# Patient Record
Sex: Male | Born: 1940 | Race: White | Hispanic: No | Marital: Married | Smoking: Never smoker
Health system: Southern US, Community
[De-identification: ages and names within clinical notes are randomized; demographics above are authoritative.]

## PROBLEM LIST (undated history)

## (undated) DIAGNOSIS — G309 Alzheimer's disease, unspecified: Secondary | ICD-10-CM

## (undated) DIAGNOSIS — I509 Heart failure, unspecified: Secondary | ICD-10-CM

## (undated) DIAGNOSIS — F0280 Dementia in other diseases classified elsewhere without behavioral disturbance: Secondary | ICD-10-CM

## (undated) DIAGNOSIS — R269 Unspecified abnormalities of gait and mobility: Secondary | ICD-10-CM

## (undated) DIAGNOSIS — G629 Polyneuropathy, unspecified: Secondary | ICD-10-CM

## (undated) DIAGNOSIS — R32 Unspecified urinary incontinence: Secondary | ICD-10-CM

## (undated) DIAGNOSIS — M21372 Foot drop, left foot: Secondary | ICD-10-CM

## (undated) DIAGNOSIS — M21371 Foot drop, right foot: Secondary | ICD-10-CM

## (undated) DIAGNOSIS — I10 Essential (primary) hypertension: Secondary | ICD-10-CM

## (undated) DIAGNOSIS — E1142 Type 2 diabetes mellitus with diabetic polyneuropathy: Secondary | ICD-10-CM

## (undated) DIAGNOSIS — F0390 Unspecified dementia without behavioral disturbance: Secondary | ICD-10-CM

## (undated) DIAGNOSIS — E119 Type 2 diabetes mellitus without complications: Secondary | ICD-10-CM

## (undated) HISTORY — PX: OTHER SURGICAL HISTORY: SHX169

## (undated) HISTORY — DX: Unspecified abnormalities of gait and mobility: R26.9

## (undated) HISTORY — DX: Alzheimer's disease, unspecified: G30.9

## (undated) HISTORY — DX: Type 2 diabetes mellitus without complications: E11.9

## (undated) HISTORY — DX: Dementia in other diseases classified elsewhere without behavioral disturbance: F02.80

## (undated) HISTORY — DX: Foot drop, left foot: M21.371

## (undated) HISTORY — DX: Polyneuropathy, unspecified: G62.9

## (undated) HISTORY — DX: Unspecified dementia, unspecified severity, without behavioral disturbance, psychotic disturbance, mood disturbance, and anxiety: F03.90

## (undated) HISTORY — DX: Heart failure, unspecified: I50.9

## (undated) HISTORY — DX: Type 2 diabetes mellitus with diabetic polyneuropathy: E11.42

## (undated) HISTORY — DX: Unspecified urinary incontinence: R32

## (undated) HISTORY — DX: Essential (primary) hypertension: I10

## (undated) HISTORY — DX: Foot drop, right foot: M21.372

---

## 2018-06-27 ENCOUNTER — Other Ambulatory Visit (HOSPITAL_COMMUNITY)
Admission: RE | Admit: 2018-06-27 | Discharge: 2018-06-27 | Disposition: A | Discharge status: Home / Self Care | Payer: Medicare Other | Source: Other Acute Inpatient Hospital | Attending: Urology | Admitting: Urology

## 2018-06-27 ENCOUNTER — Ambulatory Visit (INDEPENDENT_AMBULATORY_CARE_PROVIDER_SITE_OTHER): Payer: Medicare Other | Admitting: Urology

## 2018-06-27 DIAGNOSIS — N401 Enlarged prostate with lower urinary tract symptoms: Secondary | ICD-10-CM | POA: Diagnosis not present

## 2018-06-27 DIAGNOSIS — N3001 Acute cystitis with hematuria: Secondary | ICD-10-CM | POA: Diagnosis present

## 2018-06-27 DIAGNOSIS — R310 Gross hematuria: Secondary | ICD-10-CM

## 2018-06-27 DIAGNOSIS — N471 Phimosis: Secondary | ICD-10-CM

## 2018-06-27 DIAGNOSIS — R350 Frequency of micturition: Secondary | ICD-10-CM

## 2018-06-27 DIAGNOSIS — R32 Unspecified urinary incontinence: Secondary | ICD-10-CM | POA: Diagnosis not present

## 2018-06-30 LAB — URINE CULTURE: Culture: >=100000 — AB

## 2018-07-19 ENCOUNTER — Encounter: Payer: Self-pay | Admitting: Neurology

## 2018-07-19 ENCOUNTER — Telehealth: Payer: Self-pay | Admitting: Neurology

## 2018-07-19 ENCOUNTER — Ambulatory Visit (INDEPENDENT_AMBULATORY_CARE_PROVIDER_SITE_OTHER): Payer: Medicare Other | Admitting: Neurology

## 2018-07-19 ENCOUNTER — Other Ambulatory Visit: Payer: Self-pay

## 2018-07-19 VITALS — BP 130/68 | HR 68 | Ht 71.0 in

## 2018-07-19 DIAGNOSIS — R413 Other amnesia: Secondary | ICD-10-CM

## 2018-07-19 DIAGNOSIS — R269 Unspecified abnormalities of gait and mobility: Secondary | ICD-10-CM | POA: Diagnosis not present

## 2018-07-19 DIAGNOSIS — E538 Deficiency of other specified B group vitamins: Secondary | ICD-10-CM | POA: Diagnosis not present

## 2018-07-19 MED ORDER — ALPRAZOLAM 0.5 MG PO TABS: ORAL_TABLET | ORAL | 0 refills | Status: DC

## 2018-07-19 NOTE — Progress Notes (Signed)
Reason for visit: Dementia, gait disorder  Referring physician: Dr. [name] is a 77 y.o. male  History of present illness:  Richard Eaton is a 77 year old right-handed white male with a history of diabetes.  The patient apparently has never really gone to doctors throughout his life, he comes in with his wife today who gives an excellent history.  The patient apparently began having problems with walking that began in 2017.  Shortly thereafter, the patient was noted to have a left-sided foot drop, and he began to have some trouble controlling the bladder.  The patient also began having some troubles with memory around that time.  These issues have gradually progressed, he never went to the doctor about these issues.  The patient became severely disabled toward the end of June 2019.  He was unable to walk at all 3 weeks prior to the June 30 admission.  The patient was quite confused, agitated and combative.  The patient underwent a CT scan of the brain that is brought for my review.  This shows at least a moderate level small vessel ischemic changes.  There may be some mild ventricular enlargement, but not severe.  A partial MRI of the brain was done.  The patient has been placed back into the home environment, he is getting physical therapy, he is complaining of ongoing dizziness and he has continued to have hallucinations.  He continues to have a memory issue.  He denies any neck pain or low back pain.  He denies numbness of the extremities or burning in the feet.  He continues to have urinary incontinence.  The patient requires assistance with standing.  He is unable to stand independently.  He is sent to this office for an evaluation.  Past Medical History:  Diagnosis Date  . Bilateral foot-drop   . Congestive heart failure (CHF) (HCC)   . Dementia   . Diabetes (HCC)   . Diabetic peripheral neuropathy (HCC)   . Gait disorder   . Hypertension   . Urinary incontinence      History reviewed. No pertinent surgical history.  Family History  Problem Relation Age of Onset  . Heart attack Father     Social history:  has no tobacco, alcohol, and drug history on file.  Medications:  Prior to Admission medications   Medication Sig Start Date End Date Taking? Authorizing Provider  amLODipine (NORVASC) 10 MG tablet Take 10 mg by mouth daily.   Yes [provider]  EASY COMFORT LANCETS MISC USE TO TEST BLOOD SUGAR TWICE DAILY 05/30/18  Yes [provider]  glucose blood test strip USE TO TEST BLOOD SUGAR TWICE DAILY 05/30/18  Yes [provider]  losartan (COZAAR) 100 MG tablet Take 1 tablet by mouth daily. 06/29/18  Yes [provider]  metFORMIN (GLUCOPHAGE-XR) 500 MG 24 hr tablet Take 1 tablet by mouth daily with breakfast.  06/12/18  Yes [provider]  mirtazapine (REMERON) 15 MG tablet Take 15 mg by mouth at bedtime. 06/12/18  Yes [provider]  promethazine (PHENERGAN) 25 MG tablet Take 25 mg by mouth as needed. 06/29/18  Yes [provider]  sertraline (ZOLOFT) 25 MG tablet Take 25 mg by mouth daily. 06/12/18 12/09/18 Yes [provider]  tamsulosin (FLOMAX) 0.4 MG CAPS capsule Take 0.4 mg by mouth at bedtime. 06/28/18  Yes [provider]  triamcinolone cream (KENALOG) 0.1 % Apply 1 application topically as needed.  06/12/18 06/09/2019 Yes [provider]  ALPRAZolam (XANAX) 0.5 MG tablet Take 2 tablets approximately 45 minutes prior to the MRI study, take a third tablet if needed. 07/19/18   York Spaniel, MD     No Known Allergies  ROS:  Out of a complete 14 system review of symptoms, the patient complains only of the following symptoms, and all other reviewed systems are negative.  Fatigue Hearing loss Bowel and urine incontinence Feeling cold Confusion, dizziness Anxiety, too much sleep, disinterest in activities, hallucinations  Blood pressure 130/68, pulse 68,  height 5\' 11"  (1.803 m), SpO2 98 %.  Physical Exam  General: The patient is alert and cooperative at the time of the examination.  Eyes: Pupils are equal, round, and reactive to light. Discs are flat bilaterally.  Neck: The neck is supple, no carotid bruits are noted.  Respiratory: The respiratory examination is clear.  Cardiovascular: The cardiovascular examination reveals a regular rate and rhythm, no obvious murmurs or rubs are noted.  Skin: Extremities are without significant edema.  Neurologic Exam  Mental status: The patient is alert and oriented x 2 at the time of the examination (not oriented to date). The Mini-Mental status examination done today shows a total score of 18/30.  Cranial nerves: Facial symmetry is present. There is good sensation of the face to pinprick and soft touch bilaterally. The strength of the facial muscles and the muscles to head turning and shoulder shrug are normal bilaterally. Speech is well enunciated, no aphasia or dysarthria is noted. Extraocular movements are full. Visual fields are full. The tongue is midline, and the patient has symmetric elevation of the soft palate. No obvious hearing deficits are noted.  Motor: The motor testing reveals 5 over 5 strength of all 4 extremities, with exception of prominent bilateral foot drops. Good symmetric motor tone is noted throughout.  Sensory: Sensory testing is intact to pinprick, soft touch, vibration sensation, and position sense on the upper extremities.  With the lower extremities, there is a stocking pattern pinprick sensory deficit to just below the knees bilaterally, significant impairment of vibration and position sense is noted in both feet.  No evidence of extinction is noted.  Coordination: Cerebellar testing reveals good finger-nose-finger and heel-to-shin bilaterally.  Gait and station: The patient requires assistance with standing.  Once up, he is only able to maintain balance by holding onto  the examiner.  He can take a few steps with the examiner, gait is wide-based and unsteady.  Tandem gait was not attempted.  Romberg is positive.  Reflexes: Deep tendon reflexes are symmetric, but are depressed bilaterally. Toes are downgoing bilaterally.   Assessment/Plan:  1.  Progressive dementia  2.  Progressive gait disorder  3.  Urinary incontinence  4.  Peripheral neuropathy by clinical examination  5.  Diabetes  6.  Bilateral foot drops  The patient has a relatively severe peripheral neuropathy that likely is associated in part with the balance issue.  CT scan evaluation shows at least a moderate level of small vessel ischemic changes, the gait disorder may be multifactorial in nature.  The possibility of normal pressure hydrocephalus does need to be considered but the patient does not appear to have severe ventriculomegaly.  MRI of the brain will be done to confirm the extent of the small vessel ischemic changes.  The patient is getting ongoing physical therapy.  Blood work will be done today.  The patient will follow-up in 3 months.  Marlan Palau MD 07/19/2018 12:07 PM  [facility] [address] [place], [place] [address]  Phone [phone] Fax [phone]

## 2018-07-19 NOTE — Telephone Encounter (Signed)
Medicare order sent to GI. No auth they will reach out to the pt to schedule.  °

## 2018-07-20 NOTE — Telephone Encounter (Signed)
Pt and pts wife Marjory SneddonJanye requesting a call as soon as possible to discuss GI, stating they would like to go elsewhere

## 2018-07-20 NOTE — Telephone Encounter (Signed)
Spoke to pt wife she did not want to go to GI.Marland Kitchen. The patient is scheduled at [facility] for [facility]/01/18 and to arrive at 11:30 am. They are aware of time & day. She also has their number of [phone] just incase they needed to r/s for any reason.

## 2018-07-21 LAB — ANA W/REFLEX: Anti Nuclear Antibody(ANA): NEGATIVE

## 2018-07-21 LAB — AMMONIA: Ammonia: 49 ug/dL (ref 27–102)

## 2018-07-21 LAB — MULTIPLE MYELOMA PANEL, SERUM
ALBUMIN/GLOB SERPL: 1.1 (ref 0.7–1.7)
ALPHA 1: 0.3 g/dL (ref 0.0–0.4)
ALPHA2 GLOB SERPL ELPH-MCNC: 0.9 g/dL (ref 0.4–1.0)
Albumin SerPl Elph-Mcnc: 3.7 g/dL (ref 2.9–4.4)
B-GLOBULIN SERPL ELPH-MCNC: 1.3 g/dL (ref 0.7–1.3)
GAMMA GLOB SERPL ELPH-MCNC: 1 g/dL (ref 0.4–1.8)
GLOBULIN, TOTAL: 3.5 g/dL (ref 2.2–3.9)
IGG (IMMUNOGLOBIN G), SERUM: 1094 mg/dL (ref 700–1600)
IGM (IMMUNOGLOBULIN M), SRM: 61 mg/dL (ref 15–143)
IgA/Immunoglobulin A, Serum: 355 mg/dL (ref 61–437)
Total Protein: 7.2 g/dL (ref 6.0–8.5)

## 2018-07-21 LAB — RHEUMATOID FACTOR: Rhuematoid fact SerPl-aCnc: <10 IU/mL (ref 0.0–13.9)

## 2018-07-21 LAB — COPPER, SERUM: Copper: 134 ug/dL (ref 72–166)

## 2018-07-21 LAB — RPR: RPR: NONREACTIVE

## 2018-07-21 LAB — SEDIMENTATION RATE: Sed Rate: 5 mm/hr (ref 0–30)

## 2018-07-21 LAB — ANGIOTENSIN CONVERTING ENZYME: Angio Convert Enzyme: 19 U/L (ref 14–82)

## 2018-07-21 LAB — HIV ANTIBODY (ROUTINE TESTING W REFLEX): HIV Screen 4th Generation wRfx: NONREACTIVE

## 2018-07-21 LAB — VITAMIN B12: Vitamin B-12: 645 pg/mL (ref 232–1245)

## 2018-07-21 LAB — B. BURGDORFI ANTIBODIES

## 2018-07-24 ENCOUNTER — Telehealth: Payer: Self-pay | Admitting: *Deleted

## 2018-07-24 NOTE — Telephone Encounter (Signed)
Called and spoke with wife, Richard BarefootJanye ("Janeen") ([place]) about lab results per Dr. Anne HahnWillis note. She verbalized understanding and will let her husband know. He was sleeping at time of call.

## 2018-07-24 NOTE — Telephone Encounter (Signed)
-----   Message from York Spanielharles K Willis, MD sent at 07/21/2018  1:49 PM EDT -----  The blood work results are unremarkable. Please call the patient.  ----- Message ----- From: Nell RangeInterface, [facility] Lab Results In Sent: 07/20/2018   7:39 AM EDT To: York Spanielharles K Willis, MD

## 2018-08-01 ENCOUNTER — Ambulatory Visit (HOSPITAL_COMMUNITY)
Admission: RE | Admit: 2018-08-01 | Discharge: 2018-08-01 | Disposition: A | Discharge status: Home / Self Care | Payer: Medicare Other | Source: Ambulatory Visit | Attending: Neurology | Admitting: Neurology

## 2018-08-01 ENCOUNTER — Telehealth: Payer: Self-pay | Admitting: Neurology

## 2018-08-01 DIAGNOSIS — R269 Unspecified abnormalities of gait and mobility: Secondary | ICD-10-CM

## 2018-08-01 DIAGNOSIS — I6782 Cerebral ischemia: Secondary | ICD-10-CM | POA: Diagnosis not present

## 2018-08-01 DIAGNOSIS — E1142 Type 2 diabetes mellitus with diabetic polyneuropathy: Secondary | ICD-10-CM

## 2018-08-01 DIAGNOSIS — R413 Other amnesia: Secondary | ICD-10-CM | POA: Diagnosis present

## 2018-08-01 MED ORDER — MEMANTINE HCL 5 MG PO TABS: ORAL_TABLET | ORAL | 0 refills | Status: DC

## 2018-08-01 NOTE — Telephone Encounter (Signed)
Faxed rx namenda to [facility] at [phone]. Received fax confirmation.   Faxed rx for lift chair to [facility] at [phone]. Received fax confirmation.

## 2018-08-01 NOTE — Telephone Encounter (Signed)
  I called the patient, talk with the wife.  The patient has a moderate level small vessel disease, no evidence of normal pressure hydrocephalus.  We will start Namenda for the memory, he has had significant issues with diarrhea and poor appetite, Aricept would not work out well.  I have written a prescription for lift chair for home use.  MRI brain 08/01/18:  IMPRESSION: 1. No acute intracranial abnormality. 2. Chronic small vessel disease, moderate for age overall and most pronounced in the pons. 3. Cerebral volume and ventricular size appear within normal limits for age.

## 2018-08-03 ENCOUNTER — Telehealth: Payer: Self-pay | Admitting: *Deleted

## 2018-08-03 DIAGNOSIS — R269 Unspecified abnormalities of gait and mobility: Secondary | ICD-10-CM

## 2018-08-03 NOTE — Telephone Encounter (Addendum)
Took call from phone staff. Spoke with Debbie with [facility]. He was discharged from home care and they would like a referral to outpt therapy. He was discharged from nursing services a couple weeks ago. They have exhausted everything they can offer in home care setting. They are having difficulty scheduling pt for visits. Pt lives in [place]. Would like a referral placed to [facility] outpt clinic.   Her call back [phone]

## 2018-08-04 NOTE — Telephone Encounter (Signed)
I will go ahead and put in a referral for physical therapy for gait training.  Unfortunately, the patient has significant dementia as well that may impair his ability to benefit from the physical therapy.

## 2018-09-21 ENCOUNTER — Other Ambulatory Visit: Payer: Self-pay | Admitting: *Deleted

## 2018-09-21 ENCOUNTER — Other Ambulatory Visit: Payer: Self-pay | Admitting: Neurology

## 2018-09-21 MED ORDER — MEMANTINE HCL 10 MG PO TABS: 10.0000 mg | ORAL_TABLET | Freq: Two times a day (BID) | ORAL | 0 refills | Status: DC

## 2018-10-16 ENCOUNTER — Telehealth: Payer: Self-pay | Admitting: Neurology

## 2018-10-16 NOTE — Telephone Encounter (Signed)
Called, r/s for 11/28/17 at 12pm with Dr. Anne HahnWillis.

## 2018-10-16 NOTE — Telephone Encounter (Signed)
Pts wife Junie PanningJayne called stating the pt is currently sick and unable to make his appt tomorrow. Did not wish to r/s for Dr. Anne HahnWillis in June nor a NP. Requesting the RN call to discuss the pt being worked in sometime in lat January if possible. Please advise

## 2018-10-17 ENCOUNTER — Ambulatory Visit: Payer: Medicare Other | Admitting: Neurology

## 2018-11-28 ENCOUNTER — Ambulatory Visit (INDEPENDENT_AMBULATORY_CARE_PROVIDER_SITE_OTHER): Payer: Medicare Other | Admitting: Neurology

## 2018-11-28 ENCOUNTER — Ambulatory Visit: Payer: Self-pay | Admitting: Neurology

## 2018-11-28 ENCOUNTER — Encounter: Payer: Self-pay | Admitting: Neurology

## 2018-11-28 DIAGNOSIS — G603 Idiopathic progressive neuropathy: Secondary | ICD-10-CM | POA: Diagnosis not present

## 2018-11-28 DIAGNOSIS — M21371 Foot drop, right foot: Secondary | ICD-10-CM | POA: Diagnosis not present

## 2018-11-28 DIAGNOSIS — M21372 Foot drop, left foot: Secondary | ICD-10-CM | POA: Insufficient documentation

## 2018-11-28 DIAGNOSIS — G629 Polyneuropathy, unspecified: Secondary | ICD-10-CM

## 2018-11-28 DIAGNOSIS — G309 Alzheimer's disease, unspecified: Secondary | ICD-10-CM

## 2018-11-28 DIAGNOSIS — F0280 Dementia in other diseases classified elsewhere without behavioral disturbance: Secondary | ICD-10-CM

## 2018-11-28 DIAGNOSIS — G301 Alzheimer's disease with late onset: Secondary | ICD-10-CM

## 2018-11-28 HISTORY — DX: Dementia in other diseases classified elsewhere, unspecified severity, without behavioral disturbance, psychotic disturbance, mood disturbance, and anxiety: F02.80

## 2018-11-28 HISTORY — DX: Polyneuropathy, unspecified: G62.9

## 2018-11-28 MED ORDER — SERTRALINE HCL 50 MG PO TABS: ORAL_TABLET | ORAL | 3 refills | Status: DC

## 2018-11-28 MED ORDER — MEMANTINE HCL 10 MG PO TABS: 10.0000 mg | ORAL_TABLET | Freq: Two times a day (BID) | ORAL | 3 refills | Status: DC

## 2018-11-28 NOTE — Patient Instructions (Signed)
We will go up on the zoloft to 50 mg a day for 1 week, then take 2 a day, call for any dose adjustments.

## 2018-11-28 NOTE — Progress Notes (Signed)
Reason for visit: Dementia, peripheral neuropathy, gait disorder  Carney [name] is an 78 y.o. male  History of present illness:  Mr. Richard Eaton is a 78 year old right-handed white male with a history of a severe peripheral neuropathy associated with bilateral foot drops, he also has a significant dementia.  His wife indicates that he has had a lot of separation anxiety, whenever she leaves the room he is trying to look for her, he may fall in the process.  The last fall was 1 week ago.  The patient is eating fairly well.  He is on Namenda, he tolerates the drug well.  The patient has been in physical therapy with home health therapy, they have recommended outpatient therapy as well.  The patient does not have AFO braces for his foot drops, this is contributing to his frequent falls.  He does have a Rollator to use.  He returns to this office for an evaluation.  He sleeps fairly well at night, he does not having discomfort in the legs from the neuropathy.  Past Medical History:  Diagnosis Date  . Bilateral foot-drop   . Congestive heart failure (CHF) (HCC)   . Dementia (HCC)   . Diabetes (HCC)   . Diabetic peripheral neuropathy (HCC)   . Gait disorder   . Hypertension   . Urinary incontinence     No past surgical history on file.  Family History  Problem Relation Age of Onset  . Heart attack Father     Social history:  has an unknown smoking status. He has never used smokeless tobacco. No history on file for alcohol and drug.   No Known Allergies  Medications:  Prior to Admission medications   Medication Sig Start Date End Date Taking? Authorizing Provider  amLODipine (NORVASC) 10 MG tablet Take 10 mg by mouth daily.   Yes [provider]  EASY COMFORT LANCETS MISC USE TO TEST BLOOD SUGAR TWICE DAILY 05/30/18  Yes [provider]  glucose blood test strip USE TO TEST BLOOD SUGAR TWICE DAILY 05/30/18  Yes [provider]  losartan (COZAAR) 100 MG tablet  Take 1 tablet by mouth daily. 06/29/18  Yes [provider]  memantine (NAMENDA) 10 MG tablet Take 1 tablet (10 mg total) by mouth 2 (two) times daily. 09/21/18  Yes York SpanielWillis,  K, MD  metFORMIN (GLUCOPHAGE-XR) 500 MG 24 hr tablet Take 1 tablet by mouth daily with breakfast.  06/12/18  Yes [provider]  mirtazapine (REMERON) 15 MG tablet Take 15 mg by mouth at bedtime. 06/12/18  Yes [provider]  sertraline (ZOLOFT) 25 MG tablet Take 25 mg by mouth daily. 06/12/18 12/09/18 Yes [provider]  tamsulosin (FLOMAX) 0.4 MG CAPS capsule Take 0.4 mg by mouth at bedtime. 06/28/18  Yes [provider]  triamcinolone cream (KENALOG) 0.1 % Apply 1 application topically as needed.  06/12/18 06/13/2019 Yes [provider]  promethazine (PHENERGAN) 25 MG tablet Take 25 mg by mouth as needed. 06/29/18   [provider]    ROS:  Out of a complete 14 system review of symptoms, the patient complains only of the following symptoms, and all other reviewed systems are negative.  Hearing loss Nausea Sleep talking, acting out dreams Incontinence of the bladder, frequency of urination Walking difficulty Memory loss Agitation, confusion, anxiety  Blood pressure (!) 158/88, pulse 77, weight 177 lb 5 oz (80.4 kg).  Physical Exam  General: The patient is alert and cooperative at the time of  the examination.  Skin: No significant peripheral edema is noted.   Neurologic Exam  Mental status: The patient is alert and oriented x 3 at the time of the examination. The Mini-Mental status examination done today shows a total score of 17/30.   Cranial nerves: Facial symmetry is present. Speech is normal, no aphasia or dysarthria is noted. Extraocular movements are full. Visual fields are full.  Motor: The patient has good strength in all 4 extremities, with exception the patient has significant bilateral foot drops.  Sensory examination: Soft touch  sensation is symmetric on the face, arms, and legs.  Coordination: The patient has good finger-nose-finger and heel-to-shin bilaterally.  Gait and station: The patient has a wide-based, unsteady gait.  The patient has a positive Romberg.  Tandem gait was not attempted.  Reflexes: Deep tendon reflexes are symmetric, but are depressed.   MRI brain 08/01/18:  IMPRESSION: 1. No acute intracranial abnormality. 2. Chronic small vessel disease, moderate for age overall and most pronounced in the pons. 3. Cerebral volume and ventricular size appear within normal limits for age.  * MRI scan images were reviewed online. I agree with the written report.    Assessment/Plan:  1.  Peripheral neuropathy  2.  Bilateral foot drops  3.  Gait disorder  4.  Dementia  The patient will continue the Namenda, he had significant problems with appetite in the past, Aricept cannot be used.  The patient will be set up for outpatient physical therapy.  He was given a prescription for bilateral ankle support braces to help improve gait stability.  The patient is having significant problems with separation anxiety, he will need to go up on the Zoloft to 50 mg daily for a week and then go to 100 mg a day.  The wife will call for any dose adjustments.  He will follow-up in 6 months.  Marlan Palau MD 11/28/2018 3:28 PM  [facility] [address] [place], [place] [address]  Phone [phone] Fax [phone]

## 2018-12-05 ENCOUNTER — Telehealth: Payer: Self-pay

## 2018-12-05 DIAGNOSIS — M21371 Foot drop, right foot: Secondary | ICD-10-CM

## 2018-12-05 DIAGNOSIS — M21372 Foot drop, left foot: Principal | ICD-10-CM

## 2018-12-05 NOTE — Telephone Encounter (Signed)
I will rewrite the prescription for the AFO braces.

## 2018-12-05 NOTE — Addendum Note (Signed)
Addended by: York Spaniel on: 12/05/2018 02:42 PM   Modules accepted: Orders

## 2018-12-05 NOTE — Telephone Encounter (Signed)
Doris from [employer] called stating they received the request for bilateral foot brace but advised the order for the foot drop brace needs to state: AFO brace for right and left foot.  Requesting order to be faxed to [phone].

## 2018-12-12 NOTE — Telephone Encounter (Signed)
I contacted Doris at [facility] and advised Dr. Patrecia Pace had completed the requested AFO brace order on 12/05/18. Doris states order was never received.  I advised I would refax order, Tyler Aas advised the best # to fax would be [phone]. I faxed and received confirmation.  I contacted the pt's wife  (ok per DPR) and advised order has been resubmitted. She verbalized understanding/appreciation.  She had no further questions/concers.

## 2018-12-12 NOTE — Telephone Encounter (Signed)
Pt would like to know what the update is on this prescription for the AFO's. States that [place] has called her 3 times about this. Please advise.

## 2018-12-25 ENCOUNTER — Inpatient Hospital Stay (HOSPITAL_COMMUNITY)
Admission: EM | Admit: 2018-12-25 | Discharge: 2019-01-01 | DRG: 444 | Disposition: A | Discharge status: Home Health | Payer: Medicare Other | Attending: Family Medicine | Admitting: Family Medicine

## 2018-12-25 ENCOUNTER — Other Ambulatory Visit: Payer: Self-pay

## 2018-12-25 ENCOUNTER — Emergency Department (HOSPITAL_COMMUNITY): Payer: Medicare Other

## 2018-12-25 ENCOUNTER — Encounter (HOSPITAL_COMMUNITY): Payer: Self-pay

## 2018-12-25 DIAGNOSIS — R296 Repeated falls: Secondary | ICD-10-CM | POA: Diagnosis present

## 2018-12-25 DIAGNOSIS — J189 Pneumonia, unspecified organism: Secondary | ICD-10-CM | POA: Diagnosis not present

## 2018-12-25 DIAGNOSIS — Z7982 Long term (current) use of aspirin: Secondary | ICD-10-CM

## 2018-12-25 DIAGNOSIS — Z8249 Family history of ischemic heart disease and other diseases of the circulatory system: Secondary | ICD-10-CM | POA: Diagnosis not present

## 2018-12-25 DIAGNOSIS — G309 Alzheimer's disease, unspecified: Secondary | ICD-10-CM | POA: Diagnosis present

## 2018-12-25 DIAGNOSIS — E1142 Type 2 diabetes mellitus with diabetic polyneuropathy: Secondary | ICD-10-CM | POA: Diagnosis present

## 2018-12-25 DIAGNOSIS — Z79899 Other long term (current) drug therapy: Secondary | ICD-10-CM | POA: Diagnosis not present

## 2018-12-25 DIAGNOSIS — K449 Diaphragmatic hernia without obstruction or gangrene: Secondary | ICD-10-CM | POA: Diagnosis not present

## 2018-12-25 DIAGNOSIS — G301 Alzheimer's disease with late onset: Secondary | ICD-10-CM | POA: Diagnosis not present

## 2018-12-25 DIAGNOSIS — K921 Melena: Secondary | ICD-10-CM | POA: Diagnosis present

## 2018-12-25 DIAGNOSIS — E119 Type 2 diabetes mellitus without complications: Secondary | ICD-10-CM | POA: Diagnosis not present

## 2018-12-25 DIAGNOSIS — E876 Hypokalemia: Secondary | ICD-10-CM | POA: Diagnosis present

## 2018-12-25 DIAGNOSIS — D649 Anemia, unspecified: Secondary | ICD-10-CM | POA: Diagnosis present

## 2018-12-25 DIAGNOSIS — I10 Essential (primary) hypertension: Secondary | ICD-10-CM | POA: Diagnosis present

## 2018-12-25 DIAGNOSIS — R1011 Right upper quadrant pain: Secondary | ICD-10-CM | POA: Diagnosis present

## 2018-12-25 DIAGNOSIS — K2270 Barrett's esophagus without dysplasia: Secondary | ICD-10-CM | POA: Diagnosis present

## 2018-12-25 DIAGNOSIS — K5900 Constipation, unspecified: Secondary | ICD-10-CM | POA: Diagnosis not present

## 2018-12-25 DIAGNOSIS — K7460 Unspecified cirrhosis of liver: Secondary | ICD-10-CM | POA: Diagnosis present

## 2018-12-25 DIAGNOSIS — F0280 Dementia in other diseases classified elsewhere without behavioral disturbance: Secondary | ICD-10-CM | POA: Diagnosis present

## 2018-12-25 DIAGNOSIS — R32 Unspecified urinary incontinence: Secondary | ICD-10-CM | POA: Diagnosis present

## 2018-12-25 DIAGNOSIS — R740 Nonspecific elevation of levels of transaminase and lactic acid dehydrogenase [LDH]: Secondary | ICD-10-CM | POA: Diagnosis not present

## 2018-12-25 DIAGNOSIS — K228 Other specified diseases of esophagus: Secondary | ICD-10-CM | POA: Diagnosis not present

## 2018-12-25 DIAGNOSIS — D72829 Elevated white blood cell count, unspecified: Secondary | ICD-10-CM | POA: Diagnosis not present

## 2018-12-25 DIAGNOSIS — K8000 Calculus of gallbladder with acute cholecystitis without obstruction: Secondary | ICD-10-CM | POA: Diagnosis present

## 2018-12-25 DIAGNOSIS — K760 Fatty (change of) liver, not elsewhere classified: Secondary | ICD-10-CM | POA: Diagnosis present

## 2018-12-25 DIAGNOSIS — K810 Acute cholecystitis: Secondary | ICD-10-CM | POA: Diagnosis present

## 2018-12-25 DIAGNOSIS — K254 Chronic or unspecified gastric ulcer with hemorrhage: Secondary | ICD-10-CM | POA: Diagnosis present

## 2018-12-25 DIAGNOSIS — R7401 Elevation of levels of liver transaminase levels: Secondary | ICD-10-CM

## 2018-12-25 DIAGNOSIS — L8990 Pressure ulcer of unspecified site, unspecified stage: Secondary | ICD-10-CM

## 2018-12-25 LAB — COMPREHENSIVE METABOLIC PANEL
ALK PHOS: 88 U/L (ref 38–126)
ALT: 101 U/L — ABNORMAL HIGH (ref 0–44)
ANION GAP: 12 (ref 5–15)
AST: 112 U/L — ABNORMAL HIGH (ref 15–41)
Albumin: 3.1 g/dL — ABNORMAL LOW (ref 3.5–5.0)
BUN: 54 mg/dL — ABNORMAL HIGH (ref 8–23)
CALCIUM: 9.2 mg/dL (ref 8.9–10.3)
CHLORIDE: 101 mmol/L (ref 98–111)
CO2: 24 mmol/L (ref 22–32)
Creatinine, Ser: 1.08 mg/dL (ref 0.61–1.24)
GFR calc non Af Amer: >60 mL/min (ref >60)
Glucose, Bld: 208 mg/dL — ABNORMAL HIGH (ref 70–99)
POTASSIUM: 3.3 mmol/L — AB (ref 3.5–5.1)
SODIUM: 137 mmol/L (ref 135–145)
Total Bilirubin: 1.2 mg/dL (ref 0.3–1.2)
Total Protein: 7.5 g/dL (ref 6.5–8.1)

## 2018-12-25 LAB — POC OCCULT BLOOD, ED: Fecal Occult Bld: NEGATIVE

## 2018-12-25 LAB — CBC
HCT: 35.8 % — ABNORMAL LOW (ref 39.0–52.0)
HEMOGLOBIN: 11.9 g/dL — AB (ref 13.0–17.0)
MCH: 31.1 pg (ref 26.0–34.0)
MCHC: 33.2 g/dL (ref 30.0–36.0)
MCV: 93.5 fL (ref 80.0–100.0)
NRBC: 0 % (ref 0.0–0.2)
PLATELETS: 349 10*3/uL (ref 150–400)
RBC: 3.83 MIL/uL — AB (ref 4.22–5.81)
RDW: 12.2 % (ref 11.5–15.5)
WBC: 18.8 10*3/uL — ABNORMAL HIGH (ref 4.0–10.5)

## 2018-12-25 LAB — TYPE AND SCREEN
ABO/RH(D): O POS
Antibody Screen: NEGATIVE

## 2018-12-25 LAB — LIPASE, BLOOD: LIPASE: 15 U/L (ref 11–51)

## 2018-12-25 LAB — PROTIME-INR
INR: 1.2
Prothrombin Time: 15.1 seconds (ref 11.4–15.2)

## 2018-12-25 MED ORDER — ONDANSETRON HCL 4 MG/2ML IJ SOLN
4.0000 mg | Freq: Once | INTRAMUSCULAR | Status: AC
  Administered 2018-12-25: 4 mg via INTRAVENOUS
  Filled 2018-12-25: qty 2

## 2018-12-25 MED ORDER — SODIUM CHLORIDE 0.9 % IV BOLUS
1000.0000 mL | Freq: Once | INTRAVENOUS | Status: AC
  Administered 2018-12-25: 1000 mL via INTRAVENOUS

## 2018-12-25 MED ORDER — ONDANSETRON HCL 4 MG/2ML IJ SOLN
4.0000 mg | Freq: Four times a day (QID) | INTRAMUSCULAR | Status: DC | PRN
  Administered 2018-12-26 – 2018-12-31 (×2): 4 mg via INTRAVENOUS
  Filled 2018-12-25 (×2): qty 2

## 2018-12-25 MED ORDER — FENTANYL CITRATE (PF) 100 MCG/2ML IJ SOLN
25.0000 ug | INTRAMUSCULAR | Status: DC | PRN
  Administered 2018-12-26 (×2): 50 ug via INTRAVENOUS
  Filled 2018-12-25 (×2): qty 2

## 2018-12-25 MED ORDER — ACETAMINOPHEN 325 MG PO TABS: 650.0000 mg | ORAL_TABLET | Freq: Four times a day (QID) | ORAL | Status: DC | PRN

## 2018-12-25 MED ORDER — PANTOPRAZOLE SODIUM 40 MG IV SOLR
40.0000 mg | Freq: Two times a day (BID) | INTRAVENOUS | Status: DC
  Administered 2018-12-26 – 2019-01-01 (×13): 40 mg via INTRAVENOUS
  Filled 2018-12-25 (×13): qty 40

## 2018-12-25 MED ORDER — POTASSIUM CHLORIDE IN NACL 20-0.9 MEQ/L-% IV SOLN
INTRAVENOUS | Status: AC
  Administered 2018-12-26 (×3): via INTRAVENOUS
  Filled 2018-12-25: qty 1000

## 2018-12-25 MED ORDER — IOHEXOL 300 MG/ML  SOLN
100.0000 mL | Freq: Once | INTRAMUSCULAR | Status: AC | PRN
  Administered 2018-12-25: 100 mL via INTRAVENOUS

## 2018-12-25 MED ORDER — PIPERACILLIN-TAZOBACTAM 3.375 G IVPB 30 MIN
3.3750 g | Freq: Once | INTRAVENOUS | Status: AC
  Administered 2018-12-25: 3.375 g via INTRAVENOUS
  Filled 2018-12-25: qty 50

## 2018-12-25 MED ORDER — ONDANSETRON HCL 4 MG PO TABS: 4.0000 mg | ORAL_TABLET | Freq: Four times a day (QID) | ORAL | Status: DC | PRN

## 2018-12-25 MED ORDER — SODIUM CHLORIDE 0.9 % IV SOLN
Freq: Once | INTRAVENOUS | Status: AC
  Administered 2018-12-25: 23:00:00 via INTRAVENOUS

## 2018-12-25 MED ORDER — SODIUM CHLORIDE 0.9% FLUSH: 3.0000 mL | Freq: Once | INTRAVENOUS | Status: DC

## 2018-12-25 MED ORDER — INSULIN ASPART 100 UNIT/ML ~~LOC~~ SOLN
0.0000 [IU] | SUBCUTANEOUS | Status: DC
  Administered 2018-12-26: 2 [IU] via SUBCUTANEOUS
  Administered 2018-12-26: 1 [IU] via SUBCUTANEOUS
  Administered 2018-12-26 (×2): 2 [IU] via SUBCUTANEOUS
  Administered 2018-12-27 (×4): 1 [IU] via SUBCUTANEOUS
  Administered 2018-12-27: 2 [IU] via SUBCUTANEOUS
  Administered 2018-12-27: 1 [IU] via SUBCUTANEOUS
  Administered 2018-12-28: 3 [IU] via SUBCUTANEOUS
  Administered 2018-12-28 (×2): 1 [IU] via SUBCUTANEOUS
  Administered 2018-12-28 (×3): 2 [IU] via SUBCUTANEOUS
  Administered 2018-12-29 (×2): 1 [IU] via SUBCUTANEOUS
  Administered 2018-12-29: 2 [IU] via SUBCUTANEOUS
  Administered 2018-12-29: 1 [IU] via SUBCUTANEOUS
  Administered 2018-12-29 – 2018-12-30 (×2): 2 [IU] via SUBCUTANEOUS
  Administered 2018-12-30: 1 [IU] via SUBCUTANEOUS
  Administered 2018-12-30: 2 [IU] via SUBCUTANEOUS
  Administered 2018-12-30: 0 [IU] via SUBCUTANEOUS
  Filled 2018-12-25 (×2): qty 1

## 2018-12-25 MED ORDER — ACETAMINOPHEN 650 MG RE SUPP: 650.0000 mg | Freq: Four times a day (QID) | RECTAL | Status: DC | PRN

## 2018-12-25 MED ORDER — MORPHINE SULFATE (PF) 4 MG/ML IV SOLN
4.0000 mg | Freq: Once | INTRAVENOUS | Status: AC
  Administered 2018-12-25: 4 mg via INTRAVENOUS
  Filled 2018-12-25: qty 1

## 2018-12-25 NOTE — ED Provider Notes (Signed)
[facility] Provider Note   CSN: [id number] Arrival date & time: 12/25/18  1545    History   Chief Complaint Chief Complaint  Patient presents with  . Abdominal Pain  . Melena    HPI Richard Eaton is a 78 y.o. male.  Level 5 caveat secondary to dementia.  78 year old male brought in by his wife for evaluation of abdominal pain.  Sounds like it started about 5 days ago were intermittent right upper quadrant abdominal pain radiating through to his back and shoulder.  He has not been eating much but has been drinking plenty of fluids.  There is been an on-and-off fever with a T-max of 101.  She says about 2 days ago he started having some dark tarry stools.  No prior surgical history not on any anticoagulation.  They have tried nothing for her symptoms.  He refused to come to the hospital until today.  Currently he denies any his abdominal pain but it sounds like he has been hunched over holding his side the entire time in the waiting room.     The history is provided by the patient and the spouse.  Abdominal Pain  Pain location:  RUQ Pain radiates to:  R shoulder Pain severity:  Unable to specify Onset quality:  Gradual Duration:  5 days Timing:  Intermittent Progression:  Waxing and waning Chronicity:  New Context: not recent travel, not sick contacts, not suspicious food intake and not trauma   Relieved by:  None tried Worsened by:  Nothing Ineffective treatments:  None tried Associated symptoms: anorexia, fever and melena   Associated symptoms: no chest pain, no cough, no dysuria, no hematemesis, no hematochezia, no hematuria, no shortness of breath, no sore throat and no vomiting     Past Medical History:  Diagnosis Date  . Alzheimer disease (HCC) 11/28/2018  . Bilateral foot-drop   . Congestive heart failure (CHF) (HCC)   . Dementia (HCC)   . Diabetes (HCC)   . Diabetic peripheral neuropathy (HCC)   . Gait disorder   . Hypertension   .  Peripheral neuropathy 11/28/2018  . Urinary incontinence     Patient Active Problem List   Diagnosis Date Noted  . Alzheimer disease (HCC) 11/28/2018  . Peripheral neuropathy 11/28/2018  . Bilateral foot-drop 11/28/2018    History reviewed. No pertinent surgical history.      Home Medications    Prior to Admission medications   Medication Sig Start Date End Date Taking? Authorizing Provider  amLODipine (NORVASC) 10 MG tablet Take 10 mg by mouth daily.    [provider]  EASY COMFORT LANCETS MISC USE TO TEST BLOOD SUGAR TWICE DAILY 05/30/18   [provider]  glucose blood test strip USE TO TEST BLOOD SUGAR TWICE DAILY 05/30/18   [provider]  losartan (COZAAR) 100 MG tablet Take 1 tablet by mouth daily. 06/29/18   [provider]  memantine (NAMENDA) 10 MG tablet Take 1 tablet (10 mg total) by mouth 2 (two) times daily. 11/28/18   Richard Eaton, Charles K, MD  metFORMIN (GLUCOPHAGE-XR) 500 MG 24 hr tablet Take 1 tablet by mouth daily with breakfast.  06/12/18   [provider]  mirtazapine (REMERON) 15 MG tablet Take 15 mg by mouth at bedtime. 06/12/18   [provider]  promethazine (PHENERGAN) 25 MG tablet Take 25 mg by mouth as needed. 06/29/18   [provider]  sertraline (ZOLOFT) 50 MG tablet One tablet daily for one week,  then take 2 tablets daily 11/28/18   Richard Spaniel, MD  tamsulosin (FLOMAX) 0.4 MG CAPS capsule Take 0.4 mg by mouth at bedtime. 06/28/18   [provider]  triamcinolone cream (KENALOG) 0.1 % Apply 1 application topically as needed.  06/12/18 06/25/2019  [provider]    Family History Family History  Problem Relation Age of Onset  . Heart attack Father     Social History Social History   Tobacco Use  . Smoking status: Unknown If Ever Smoked  . Smokeless tobacco: Never Used  Substance Use Topics  . Alcohol use: Never    Frequency: Never  . Drug use: Never     Allergies    Patient has no known allergies.   Review of Systems Review of Systems  Constitutional: Positive for fever.  HENT: Negative for sore throat.   Eyes: Negative for visual disturbance.  Respiratory: Negative for cough and shortness of breath.   Cardiovascular: Negative for chest pain.  Gastrointestinal: Positive for abdominal pain, anorexia and melena. Negative for hematemesis, hematochezia and vomiting.  Genitourinary: Negative for dysuria and hematuria.  Musculoskeletal: Negative for neck pain.  Skin: Negative for rash.  Neurological: Negative for headaches.     Physical Exam Updated Vital Signs BP 122/67 (BP Location: Left Arm)   Pulse 78   Temp 97.7 F (36.5 C) (Oral)   Resp 18   Ht 5\' 11"  (1.803 m)   Wt 80.7 kg   SpO2 96%   BMI 24.83 kg/m   Physical Exam Vitals signs and nursing note reviewed.  Constitutional:      Appearance: He is well-developed.  HENT:     Head: Normocephalic and atraumatic.  Eyes:     Conjunctiva/sclera: Conjunctivae normal.  Neck:     Musculoskeletal: Neck supple.  Cardiovascular:     Rate and Rhythm: Normal rate and regular rhythm.     Heart sounds: No murmur.  Pulmonary:     Effort: Pulmonary effort is normal. No respiratory distress.     Breath sounds: Normal breath sounds.  Abdominal:     Palpations: Abdomen is soft.     Tenderness: There is no abdominal tenderness. There is no guarding or rebound.  Skin:    General: Skin is warm and dry.     Capillary Refill: Capillary refill takes less than 2 seconds.  Neurological:     General: No focal deficit present.     Mental Status: He is alert. He is disoriented.     Comments: Disoriented to time and place.  He follows commands and is moving everything non-focally.      ED Treatments / Results  Labs (all labs ordered are listed, but only abnormal results are displayed) Labs Reviewed  COMPREHENSIVE METABOLIC PANEL - Abnormal; Notable for the following components:      Result Value    Potassium 3.3 (*)    Glucose, Bld 208 (*)    BUN 54 (*)    Albumin 3.1 (*)    AST 112 (*)    ALT 101 (*)    All other components within normal limits  CBC - Abnormal; Notable for the following components:   WBC 18.8 (*)    RBC 3.83 (*)    Hemoglobin 11.9 (*)    HCT 35.8 (*)    All other components within normal limits  LIPASE, BLOOD  PROTIME-INR  URINALYSIS, ROUTINE W REFLEX MICROSCOPIC  POC OCCULT BLOOD, ED  TYPE AND SCREEN    EKG None  Radiology Ct Abdomen Pelvis W Contrast  Result Date: 12/25/2018 CLINICAL DATA:  Abdominal pain with fever EXAM: CT ABDOMEN AND PELVIS WITH CONTRAST TECHNIQUE: Multidetector CT imaging of the abdomen and pelvis was performed using the standard protocol following bolus administration of intravenous contrast. CONTRAST:  100mL OMNIPAQUE IOHEXOL 300 MG/ML  SOLN COMPARISON:  None. FINDINGS: Lower chest: Small right-sided pleural effusion. Partial atelectasis in the right lower lobe. The heart size is within normal limits. Small hiatal hernia. Hepatobiliary: Distended gallbladder with multiple calcified stones. Mild soft tissue stranding in the right upper quadrant adjacent to the gallbladder. Perihepatic fluid. Probable cyst at the porta hepatis. No definite biliary enlargement. Pancreas: Unremarkable. No pancreatic ductal dilatation or surrounding inflammatory changes. Spleen: Normal in size without focal abnormality. Adrenals/Urinary Tract: Adrenal glands are normal. Parapelvic cysts. No hydronephrosis. Air within the bladder. Possible small gas containing diverticulum off the anterior wall of the bladder. Stomach/Bowel: Stomach is nonenlarged. No dilated small bowel. No colon wall thickening. Negative appendix. Sigmoid colon diverticula. Vascular/Lymphatic: Moderate aortic atherosclerosis. No aneurysm. No significantly enlarged lymph nodes. Reproductive: Enlarged prostate. Fluid-filled defect within the prostate which may be postsurgical. Other: No free  air. Trace free fluid in the pelvis. Small fat containing supraumbilical ventral hernia. Musculoskeletal: Degenerative changes. No acute or suspicious abnormality. IMPRESSION: 1. Dilated gallbladder with multiple calcified stones and suspected pericholecystic fluid and mild inflammatory changes raising concern for an acute cholecystitis. Suggest correlation with ultrasound. There is perihepatic fluid, which may be secondary to inflammatory process from the gallbladder. 2. Small right pleural effusion and partial atelectasis at the right base. 3. Bilateral parapelvic renal cysts 4. Small fat containing supraumbilical ventral hernia Electronically Signed   By: Jasmine PangKim  Fujinaga M.D.   On: 12/25/2018 22:49    Procedures .Critical Care Performed by: Terrilee FilesButler, Michael C, MD Authorized by: Terrilee FilesButler, Michael C, MD   Critical care provider statement:    Critical care time (minutes):  45   Critical care time was exclusive of:  Separately billable procedures and treating other patients   Critical care was necessary to treat or prevent imminent or life-threatening deterioration of the following conditions: acute cholecystitis - potential for sepsis.   Critical care was time spent personally by me on the following activities:  Discussions with consultants, evaluation of patient's response to treatment, examination of patient, ordering and performing treatments and interventions, ordering and review of laboratory studies, ordering and review of radiographic studies, pulse oximetry, re-evaluation of patient's condition, obtaining history from patient or surrogate, review of old charts and development of treatment plan with patient or surrogate   I assumed direction of critical care for this patient from another provider in my specialty: no     (including critical care time)  Medications Ordered in ED Medications  sodium chloride flush (NS) 0.9 % injection 3 mL ( Intravenous Canceled Entry 12/25/18 2101)  sodium  chloride 0.9 % bolus 1,000 mL (0 mLs Intravenous Stopped 12/25/18 2240)  morphine 4 MG/ML injection 4 mg (4 mg Intravenous Given 12/25/18 2040)  ondansetron (ZOFRAN) injection 4 mg (4 mg Intravenous Given 12/25/18 2040)  piperacillin-tazobactam (ZOSYN) IVPB 3.375 g (0 g Intravenous Stopped 12/25/18 2312)  iohexol (OMNIPAQUE) 300 MG/ML solution 100 mL (100 mLs Intravenous Contrast Given 12/25/18 2157)  0.9 %  sodium chloride infusion ( Intravenous Stopped 12/25/18 2312)     Initial Impression / Assessment and Plan / ED Course  I have reviewed the triage vital signs and the nursing notes.  Pertinent labs & imaging results  that were available during my care of the patient were reviewed by me and considered in my medical decision making (see chart for details).  Clinical Course as of Dec 26 2311  Mon Dec 25, 2018  2036 Rectal exam with chaperone present.  Normal sphincter tone no masses.  Sample sent to lab for guaiac.   [MB]  2244 I reviewed the case with Dr. Lovell Sheehan from general surgery who recommended that the patient be admitted to the hospital service and he will see tomorrow.   [MB]  2303 Updated the wife on the results of the CAT scan and the fact that he is going to be admitted.  His antibiotics are infusing and have ordered some maintenance fluids.  She said he is a full code.  I put out a page to the hospitalist for admission.   [MB]    Clinical Course User Index [MB] Terrilee Files, MD        Final Clinical Impressions(s) / ED Diagnoses   Final diagnoses:  Acute cholecystitis    ED Discharge Orders    None       Terrilee Files, MD 12/25/18 2316

## 2018-12-25 NOTE — H&P (Signed)
History and Physical    Richard LivingRonald Robbins ZOX:[id number] DOB: [birth year] DOA: 12/25/2018  PCP: Kela MillinBarrino, Alethea Y, MD   Patient coming from: Home   Chief Complaint: Dark tarry stool, severe RUQ pain, N/V   HPI: Richard Eaton is a 78 y.o. male with medical history significant for dementia, hypertension, and diet-controlled diabetes mellitus, now presenting to the emergency department for evaluation of severe abdominal pain, nausea, loss of appetite, and dark tarry stools.  He is accompanied by his wife who assists with the history.  He began to complain of severe pain in the right upper abdomen approximately 5 days ago, has been eating very little since that time due to worsening pain, has had nausea but vomited only early in the course, and has had dark "black" tarry and oily stool for 2 days, but no BM today.  Patient's wife reports that she has been trying to convince him to come to the ED for evaluation of this for a few days but he had been refusing until this evening.  He has not vomited in the past couple days and there was no hematemesis noted.  Patient's wife reports that he has been febrile at home.  He has not been coughing or complaining of dysuria.  ED Course: Upon arrival to the ED, patient is found to be afebrile, saturating well on room air, and with vitals otherwise normal.  Chemistry panel is notable for potassium of 3.3, transaminases in the 100 range, BUN of 54, up from 17 earlier this month, and with creatinine of 1.08.  CBC is notable for leukocytosis to 18,800 and a normocytic anemia with hemoglobin 11.9.  Fecal occult blood testing was negative.  CT the abdomen and pelvis reveals a dilated gallbladder with multiple calcified stones and suspected pericholecystic fluid and mild inflammatory changes concerning for acute cholecystitis.  Surgery was consulted by the ED physician and recommended medical admission, indicating they will plan evaluate the patient in the morning.  The patient  was treated with a liter of normal saline, Zofran, morphine, and Zosyn.  He remains hemodynamically stable and will be admitted for further evaluation and management.  Review of Systems:  All other systems reviewed and apart from HPI, are negative.  Past Medical History:  Diagnosis Date  . Alzheimer disease (HCC) 11/28/2018  . Bilateral foot-drop   . Congestive heart failure (CHF) (HCC)   . Dementia (HCC)   . Diabetes (HCC)   . Diabetic peripheral neuropathy (HCC)   . Gait disorder   . Hypertension   . Peripheral neuropathy 11/28/2018  . Urinary incontinence     History reviewed. No pertinent surgical history.   has an unknown smoking status. He has never used smokeless tobacco. He reports that he does not drink alcohol or use drugs.  No Known Allergies  Family History  Problem Relation Age of Onset  . Heart attack Father      Prior to Admission medications   Medication Sig Start Date End Date Taking? Authorizing Provider  acetaminophen (TYLENOL) 500 MG tablet Take 1,000 mg by mouth every 6 (six) hours as needed for mild pain or moderate pain.   Yes [provider]  amLODipine (NORVASC) 10 MG tablet Take 10 mg by mouth at bedtime.    Yes [provider]  aspirin EC 81 MG tablet Take 81 mg by mouth at bedtime.   Yes [provider]  ibuprofen (ADVIL,MOTRIN) 200 MG tablet Take 400 mg by mouth every 6 (six) hours as needed for  mild pain or moderate pain.   Yes [provider]  losartan (COZAAR) 100 MG tablet Take 1 tablet by mouth at bedtime.  06/29/18  Yes [provider]  memantine (NAMENDA) 10 MG tablet Take 1 tablet (10 mg total) by mouth 2 (two) times daily. 11/28/18  Yes York Spaniel, MD  mirtazapine (REMERON) 15 MG tablet Take 15 mg by mouth at bedtime. 06/12/18  Yes [provider]  ondansetron (ZOFRAN) 4 MG tablet Take 4 mg by mouth every 12 (twelve) hours as needed for nausea or vomiting.  12/15/18  Yes [provider]  promethazine (PHENERGAN) 25 MG tablet Take 12.5-25 mg by mouth daily as needed for nausea or vomiting.  06/29/18  Yes [provider]  sertraline (ZOLOFT) 50 MG tablet One tablet daily for one week, then take 2 tablets daily Patient taking differently: Take 100 mg by mouth every morning.  11/28/18  Yes York Spaniel, MD  tamsulosin (FLOMAX) 0.4 MG CAPS capsule Take 0.4 mg by mouth at bedtime. 06/28/18  Yes [provider]  triamcinolone cream (KENALOG) 0.1 % Apply 1 application topically daily as needed (for skin irritation).  06/12/18 06-24-19 Yes [provider]    Physical Exam: Vitals:   12/25/18 1602 12/25/18 2042 12/25/18 2043  BP: 122/67 118/67   Pulse: 78  72  Resp: 18    Temp: 97.7 F (36.5 C)    TempSrc: Oral    SpO2: 96%  96%  Weight: 80.7 kg    Height: 5\' 11"  (1.803 m)      Constitutional: NAD, calm  Eyes: PERTLA, lids and conjunctivae normal ENMT: Mucous membranes are moist. Posterior pharynx clear of any exudate or lesions.   Neck: normal, supple, no masses, no thyromegaly Respiratory: clear to auscultation bilaterally, no wheezing, no crackles. Normal respiratory effort.   Cardiovascular: S1 & S2 heard, regular rate and rhythm. No extremity edema.   Abdomen: No distension, soft, tender in RUQ, no rebound pain or guarding. Bowel sounds active.  Musculoskeletal: no clubbing / cyanosis. No joint deformity upper and lower extremities.   Skin: no significant rashes, lesions, ulcers. Warm, dry, well-perfused. Neurologic: No gross facial asymmetry. Sensation intact. Moving all extremities.  Psychiatric:  Sleeping, easily woken. Calm, cooperative.    Labs on Admission: I have personally reviewed following labs and imaging studies  CBC: Recent Labs  Lab 12/25/18 1621  WBC 18.8*  HGB 11.9*  HCT 35.8*  MCV 93.5  PLT 349   Basic Metabolic Panel: Recent Labs  Lab 12/25/18 1621  NA 137  K 3.3*  CL 101  CO2 24  GLUCOSE  208*  BUN 54*  CREATININE 1.08  CALCIUM 9.2   GFR: Estimated Creatinine Clearance: 62 mL/min (by C-G formula based on SCr of 1.08 mg/dL). Liver Function Tests: Recent Labs  Lab 12/25/18 1621  AST 112*  ALT 101*  ALKPHOS 88  BILITOT 1.2  PROT 7.5  ALBUMIN 3.1*   Recent Labs  Lab 12/25/18 1621  LIPASE 15   No results for input(s): AMMONIA in the last 168 hours. Coagulation Profile: Recent Labs  Lab 12/25/18 1621  INR 1.20   Cardiac Enzymes: No results for input(s): CKTOTAL, CKMB, CKMBINDEX, TROPONINI in the last 168 hours. BNP (last 3 results) No results for input(s): PROBNP in the last 8760 hours. HbA1C: No results for input(s): HGBA1C in the last 72 hours. CBG: No results for input(s): GLUCAP in the last 168 hours. Lipid Profile: No results for input(s): CHOL,  HDL, LDLCALC, TRIG, CHOLHDL, LDLDIRECT in the last 72 hours. Thyroid Function Tests: No results for input(s): TSH, T4TOTAL, FREET4, T3FREE, THYROIDAB in the last 72 hours. Anemia Panel: No results for input(s): VITAMINB12, FOLATE, FERRITIN, TIBC, IRON, RETICCTPCT in the last 72 hours. Urine analysis: No results found for: COLORURINE, APPEARANCEUR, LABSPEC, PHURINE, GLUCOSEU, HGBUR, BILIRUBINUR, KETONESUR, PROTEINUR, UROBILINOGEN, NITRITE, LEUKOCYTESUR Sepsis Labs: @LABRCNTIP (procalcitonin:4,lacticidven:4) )No results found for this or any previous visit (from the past 240 hour(s)).   Radiological Exams on Admission: Ct Abdomen Pelvis W Contrast  Result Date: 12/25/2018 CLINICAL DATA:  Abdominal pain with fever EXAM: CT ABDOMEN AND PELVIS WITH CONTRAST TECHNIQUE: Multidetector CT imaging of the abdomen and pelvis was performed using the standard protocol following bolus administration of intravenous contrast. CONTRAST:  OMNIPAQUE IOHEXOL 300 MG/ML  SOLN COMPARISON:  None. FINDINGS: Lower chest: Small right-sided pleural effusion. Partial atelectasis in the right lower lobe. The heart size is within  normal limits. Small hiatal hernia. Hepatobiliary: Distended gallbladder with multiple calcified stones. Mild soft tissue stranding in the right upper quadrant adjacent to the gallbladder. Perihepatic fluid. Probable cyst at the porta hepatis. No definite biliary enlargement. Pancreas: Unremarkable. No pancreatic ductal dilatation or surrounding inflammatory changes. Spleen: Normal in size without focal abnormality. Adrenals/Urinary Tract: Adrenal glands are normal. Parapelvic cysts. No hydronephrosis. Air within the bladder. Possible small gas containing diverticulum off the anterior wall of the bladder. Stomach/Bowel: Stomach is nonenlarged. No dilated small bowel. No colon wall thickening. Negative appendix. Sigmoid colon diverticula. Vascular/Lymphatic: Moderate aortic atherosclerosis. No aneurysm. No significantly enlarged lymph nodes. Reproductive: Enlarged prostate. Fluid-filled defect within the prostate which may be postsurgical. Other: No free air. Trace free fluid in the pelvis. Small fat containing supraumbilical ventral hernia. Musculoskeletal: Degenerative changes. No acute or suspicious abnormality. IMPRESSION: 1. Dilated gallbladder with multiple calcified stones and suspected pericholecystic fluid and mild inflammatory changes raising concern for an acute cholecystitis. Suggest correlation with ultrasound. There is perihepatic fluid, which may be secondary to inflammatory process from the gallbladder. 2. Small right pleural effusion and partial atelectasis at the right base. 3. Bilateral parapelvic renal cysts 4. Small fat containing supraumbilical ventral hernia Electronically Signed   By: Jasmine Pang M.D.   On: 12/25/2018 22:49    EKG: Not performed.   Assessment/Plan   1. Acute cholecystitis - Presents with several days of severe RUQ pain with radiation to the scapula, nausea, loss of appetite, and fevers  - Found to have leukocytosis and CT-findings concerning for acute cholecystitis    - Surgery is consulting and much appreciated  - Treated in ED with Zosyn, IVF, and pain-control  - Continue bowel-rest, IVF hydration, pain-control, Zosyn, and check RUQ [place]    2. Melena  - Had 2 days of black tarry stool, no BM on day of presentation, no hematemesis  - FOBT was negative in ED, but BUN is now 54 with normal creatinine and was 17 earlier this month with similar creatinine  - Type and screen, start Protonix 40 mg IV q12h, repeat CBC in am    3. Type II DM  - A1c was 7.4% earlier this month  - Diet-controlled at home  - Serum glucose 208 in ED  - Check CBG's and use a low-intensity SSI with Novolog for now    4. Hypokalemia  - Serum potassium is 3.3 in ED  - KCl added to IVF - Repeat chem panel in am    5. Hypertension  - BP at goal  - Use  hydralazine IVP's prn for now    6. Dementia  - Resume Namenda when appropriate for diet    DVT prophylaxis: SCD's  Code Status: Full  Family Communication: Wife updated at bedside  Consults called: Surgery  Admission status: Inpatient     Briscoe Deutscherimothy S Goku Harb, MD [facility] Pager [phone]  If 7PM-7AM, please contact night-coverage [email or web address] Password [email or web address]  12/25/2018, 11:53 PM

## 2018-12-25 NOTE — ED Triage Notes (Signed)
Pt has been sick since Thursday. Wife thinks he had gall bladder attack. Pt has been running a fever and had black tarry stools for 2 days. Not eating well and weak. Stomach not currently hurting

## 2018-12-26 ENCOUNTER — Encounter (HOSPITAL_COMMUNITY): Payer: Self-pay | Admitting: Gastroenterology

## 2018-12-26 ENCOUNTER — Inpatient Hospital Stay (HOSPITAL_COMMUNITY): Payer: Medicare Other

## 2018-12-26 DIAGNOSIS — K8000 Calculus of gallbladder with acute cholecystitis without obstruction: Secondary | ICD-10-CM | POA: Insufficient documentation

## 2018-12-26 LAB — CBC WITH DIFFERENTIAL/PLATELET
Abs Immature Granulocytes: 0.14 10*3/uL — ABNORMAL HIGH (ref 0.00–0.07)
BASOS PCT: 0 %
Basophils Absolute: 0 10*3/uL (ref 0.0–0.1)
EOS ABS: 0.3 10*3/uL (ref 0.0–0.5)
Eosinophils Relative: 2 %
HCT: 32.6 % — ABNORMAL LOW (ref 39.0–52.0)
Hemoglobin: 10.6 g/dL — ABNORMAL LOW (ref 13.0–17.0)
Immature Granulocytes: 1 %
Lymphocytes Relative: 8 %
Lymphs Abs: 1.4 10*3/uL (ref 0.7–4.0)
MCH: 30.5 pg (ref 26.0–34.0)
MCHC: 32.5 g/dL (ref 30.0–36.0)
MCV: 93.9 fL (ref 80.0–100.0)
Monocytes Absolute: 1.3 10*3/uL — ABNORMAL HIGH (ref 0.1–1.0)
Monocytes Relative: 8 %
Neutro Abs: 13 10*3/uL — ABNORMAL HIGH (ref 1.7–7.7)
Neutrophils Relative %: 81 %
Platelets: 266 10*3/uL (ref 150–400)
RBC: 3.47 MIL/uL — AB (ref 4.22–5.81)
RDW: 12.3 % (ref 11.5–15.5)
WBC: 16.1 10*3/uL — AB (ref 4.0–10.5)
nRBC: 0 % (ref 0.0–0.2)

## 2018-12-26 LAB — COMPREHENSIVE METABOLIC PANEL
ALT: 73 U/L — ABNORMAL HIGH (ref 0–44)
AST: 52 U/L — ABNORMAL HIGH (ref 15–41)
Albumin: 2.6 g/dL — ABNORMAL LOW (ref 3.5–5.0)
Alkaline Phosphatase: 72 U/L (ref 38–126)
Anion gap: 10 (ref 5–15)
BUN: 52 mg/dL — ABNORMAL HIGH (ref 8–23)
CALCIUM: 8.5 mg/dL — AB (ref 8.9–10.3)
CO2: 21 mmol/L — ABNORMAL LOW (ref 22–32)
Chloride: 106 mmol/L (ref 98–111)
Creatinine, Ser: 0.99 mg/dL (ref 0.61–1.24)
GFR calc Af Amer: >60 mL/min (ref >60)
Glucose, Bld: 148 mg/dL — ABNORMAL HIGH (ref 70–99)
Potassium: 3.3 mmol/L — ABNORMAL LOW (ref 3.5–5.1)
Sodium: 137 mmol/L (ref 135–145)
Total Bilirubin: 0.6 mg/dL (ref 0.3–1.2)
Total Protein: 6.2 g/dL — ABNORMAL LOW (ref 6.5–8.1)

## 2018-12-26 LAB — CBG MONITORING, ED
GLUCOSE-CAPILLARY: 145 mg/dL — AB (ref 70–99)
Glucose-Capillary: 130 mg/dL — ABNORMAL HIGH (ref 70–99)
Glucose-Capillary: 142 mg/dL — ABNORMAL HIGH (ref 70–99)
Glucose-Capillary: 181 mg/dL — ABNORMAL HIGH (ref 70–99)

## 2018-12-26 LAB — GLUCOSE, CAPILLARY
Glucose-Capillary: 164 mg/dL — ABNORMAL HIGH (ref 70–99)
Glucose-Capillary: 186 mg/dL — ABNORMAL HIGH (ref 70–99)

## 2018-12-26 LAB — LIPASE, BLOOD: Lipase: 14 U/L (ref 11–51)

## 2018-12-26 LAB — MAGNESIUM: MAGNESIUM: 2.2 mg/dL (ref 1.7–2.4)

## 2018-12-26 MED ORDER — PIPERACILLIN-TAZOBACTAM 3.375 G IVPB
3.3750 g | Freq: Three times a day (TID) | INTRAVENOUS | Status: DC
  Administered 2018-12-26 – 2018-12-30 (×11): 3.375 g via INTRAVENOUS
  Filled 2018-12-26 (×11): qty 50

## 2018-12-26 MED ORDER — HYDRALAZINE HCL 20 MG/ML IJ SOLN: 10.0000 mg | INTRAMUSCULAR | Status: DC | PRN

## 2018-12-26 MED ORDER — BOOST / RESOURCE BREEZE PO LIQD CUSTOM
1.0000 | Freq: Three times a day (TID) | ORAL | Status: DC
  Administered 2018-12-26 – 2018-12-29 (×7): 1 via ORAL
  Administered 2018-12-30: 237 mL via ORAL
  Administered 2018-12-30 – 2019-01-01 (×5): 1 via ORAL

## 2018-12-26 MED ORDER — PIPERACILLIN-TAZOBACTAM 3.375 G IVPB
3.3750 g | Freq: Once | INTRAVENOUS | Status: AC
  Administered 2018-12-26: 3.375 g via INTRAVENOUS
  Filled 2018-12-26: qty 50

## 2018-12-26 NOTE — Consult Note (Signed)
Reason for Consult: Acute cholecystitis, cholelithiasis Referring Physician: Dr. Wonda Olds is an 78 y.o. Eaton.  HPI: Patient is a Richard Eaton with Alzheimer's dementia who was brought in by his wife for evaluation treatment of right upper quadrant abdominal pain and dark tarry stools.  Wife says that he started having right upper quadrant abdominal pain 5 days ago.  He also had nausea and loss of appetite.  He finally agreed to ER evaluation.  CT scan of the abdomen reveals acute cholecystitis with cholelithiasis.  He did have a leukocytosis along with elevated liver enzyme test.  His total bilirubin was within normal limits.  This morning, his leukocytosis is slightly improved as well as his liver enzyme test.  He does not have significant right upper quadrant abdominal pain.  History was obtained from the wife as the patient suffers from dementia.  Past Medical History:  Diagnosis Date  . Alzheimer disease (HCC) 11/28/2018  . Bilateral foot-drop   . Congestive heart failure (CHF) (HCC)   . Dementia (HCC)   . Diabetes (HCC)   . Diabetic peripheral neuropathy (HCC)   . Gait disorder   . Hypertension   . Peripheral neuropathy 11/28/2018  . Urinary incontinence     History reviewed. No pertinent surgical history.  Family History  Problem Relation Age of Onset  . Heart attack Father     Social History:  has an unknown smoking status. He has never used smokeless tobacco. He reports that he does not drink alcohol or use drugs.  Allergies: No Known Allergies  Medications: I have reviewed the patient's current medications.  Results for orders placed or performed during the hospital encounter of 12/25/18 (from the past 48 hour(s))  Lipase, blood     Status: None   Collection Time: 12/25/18  4:21 PM  Result Value Ref Range   Lipase 15 11 - 51 U/L    Comment: Performed at [facility], [address], [place], [place] [address]  Comprehensive metabolic panel      Status: Abnormal   Collection Time: 12/25/18  4:21 PM  Result Value Ref Range   Sodium 137 135 - 145 mmol/L   Potassium 3.3 (L) 3.5 - 5.1 mmol/L   Chloride 101 98 - 111 mmol/L   CO2 Richard 22 - 32 mmol/L   Glucose, Bld 208 (H) 70 - 99 mg/dL   BUN 54 (H) 8 - 23 mg/dL   Creatinine, Ser 1.91 0.61 - 1.Richard mg/dL   Calcium 9.2 8.9 - 47.8 mg/dL   Total Protein 7.5 6.5 - 8.1 g/dL   Albumin 3.1 (L) 3.5 - 5.0 g/dL   AST 295 (H) 15 - 41 U/L   ALT 101 (H) 0 - 44 U/L   Alkaline Phosphatase 88 38 - 126 U/L   Total Bilirubin 1.2 0.3 - 1.2 mg/dL   GFR calc non Af Amer >60 >60 mL/min   GFR calc Af Amer >60 >60 mL/min   Anion gap 12 5 - 15    Comment: Performed at [facility], [address] [place], [place] [address]  CBC     Status: Abnormal   Collection Time: 12/25/18  4:21 PM  Result Value Ref Range   WBC 18.8 (H) 4.0 - 10.5 K/uL   RBC 3.83 (L) 4.22 - 5.81 MIL/uL   Hemoglobin 11.9 (L) 13.0 - 17.0 g/dL   HCT 86.5 (L) 78.4 - 69.6 %   MCV 93.5 80.0 - 100.0 fL   MCH 31.1 26.0 -  34.0 pg   MCHC 33.2 30.0 - 36.0 g/dL   RDW 82.912.2 56.211.5 - 13.015.5 %   Platelets 349 150 - 400 K/uL   nRBC 0.0 0.0 - 0.2 %    Comment: Performed at [facility], [address] [place], [place] [address]  Protime-INR     Status: None   Collection Time: 12/25/18  4:21 PM  Result Value Ref Range   Prothrombin Time 15.1 11.4 - 15.2 seconds   INR 1.20     Comment: Performed at [facility], [address] [place], [place] [address]  Type and screen [facility]     Status: None   Collection Time: 12/25/18  8:29 PM  Result Value Ref Range   ABO/RH(D) O POS    Antibody Screen NEG    Sample Expiration      12/28/2018 Performed at [facility], [address] [place], [place] [address]   POC occult blood, ED Provider will collect     Status: None   Collection Time: 12/25/18  8:40 PM  Result Value Ref Range   Fecal Occult Bld NEGATIVE NEGATIVE  CBG monitoring, ED     Status: Abnormal   Collection Time: 12/26/18 12:36  AM  Result Value Ref Range   Glucose-Capillary 181 (H) 70 - 99 mg/dL  CBG monitoring, ED     Status: Abnormal   Collection Time: 12/26/18  4:21 AM  Result Value Ref Range   Glucose-Capillary 142 (H) 70 - 99 mg/dL  Comprehensive metabolic panel     Status: Abnormal   Collection Time: 12/26/18  5:39 AM  Result Value Ref Range   Sodium 137 135 - 145 mmol/L   Potassium 3.3 (L) 3.5 - 5.1 mmol/L   Chloride 106 98 - 111 mmol/L   CO2 21 (L) 22 - 32 mmol/L   Glucose, Bld 148 (H) 70 - 99 mg/dL   BUN 52 (H) 8 - 23 mg/dL   Creatinine, Ser 1.320.99 0.61 - 1.Richard mg/dL   Calcium 8.5 (L) 8.9 - 10.3 mg/dL   Total Protein 6.2 (L) 6.5 - 8.1 g/dL   Albumin 2.6 (L) 3.5 - 5.0 g/dL   AST 52 (H) 15 - 41 U/L   ALT 73 (H) 0 - 44 U/L   Alkaline Phosphatase 72 38 - 126 U/L   Total Bilirubin 0.6 0.3 - 1.2 mg/dL   GFR calc non Af Amer >60 >60 mL/min   GFR calc Af Amer >60 >60 mL/min   Anion gap 10 5 - 15    Comment: Performed at [facility], [address] [place], [place] [address]  CBC WITH DIFFERENTIAL     Status: Abnormal   Collection Time: 12/26/18  5:39 AM  Result Value Ref Range   WBC 16.1 (H) 4.0 - 10.5 K/uL   RBC 3.47 (L) 4.22 - 5.81 MIL/uL   Hemoglobin 10.6 (L) 13.0 - 17.0 g/dL   HCT 27.232.6 (L) 53.639.0 - 64.452.0 %   MCV 93.9 80.0 - 100.0 fL   MCH 30.5 26.0 - 34.0 pg   MCHC 32.5 30.0 - 36.0 g/dL   RDW 03.412.3 74.211.5 - 59.515.5 %   Platelets 266 150 - 400 K/uL   nRBC 0.0 0.0 - 0.2 %   Neutrophils Relative % 81 %   Neutro Abs 13.0 (H) 1.7 - 7.7 K/uL   Lymphocytes Relative 8 %   Lymphs Abs 1.4 0.7 - 4.0 K/uL   Monocytes Relative 8 %   Monocytes Absolute 1.3 (H) 0.1 -  1.0 K/uL   Eosinophils Relative 2 %   Eosinophils Absolute 0.3 0.0 - 0.5 K/uL   Basophils Relative 0 %   Basophils Absolute 0.0 0.0 - 0.1 K/uL   Immature Granulocytes 1 %   Abs Immature Granulocytes 0.14 (H) 0.00 - 0.07 K/uL    Comment: Performed at [facility], [address] [place], [place] [address]  Magnesium     Status: None    Collection Time: 12/26/18  5:39 AM  Result Value Ref Range   Magnesium 2.2 1.7 - 2.4 mg/dL    Comment: Performed at [facility], [address], [place], [place] [address]  Lipase, blood     Status: None   Collection Time: 12/26/18  5:39 AM  Result Value Ref Range   Lipase 14 11 - 51 U/L    Comment: Performed at [facility], [address], [place], [place] [address]    Ct Abdomen Pelvis W Contrast  Result Date: 12/25/2018 CLINICAL DATA:  Abdominal pain with fever EXAM: CT ABDOMEN AND PELVIS WITH CONTRAST TECHNIQUE: Multidetector CT imaging of the abdomen and pelvis was performed using the standard protocol following bolus administration of intravenous contrast. CONTRAST:  OMNIPAQUE IOHEXOL 300 MG/ML  SOLN COMPARISON:  None. FINDINGS: Lower chest: Small right-sided pleural effusion. Partial atelectasis in the right lower lobe. The heart size is within normal limits. Small hiatal hernia. Hepatobiliary: Distended gallbladder with multiple calcified stones. Mild soft tissue stranding in the right upper quadrant adjacent to the gallbladder. Perihepatic fluid. Probable cyst at the porta hepatis. No definite biliary enlargement. Pancreas: Unremarkable. No pancreatic ductal dilatation or surrounding inflammatory changes. Spleen: Normal in size without focal abnormality. Adrenals/Urinary Tract: Adrenal glands are normal. Parapelvic cysts. No hydronephrosis. Air within the bladder. Possible small gas containing diverticulum off the anterior wall of the bladder. Stomach/Bowel: Stomach is nonenlarged. No dilated small bowel. No colon wall thickening. Negative appendix. Sigmoid colon diverticula. Vascular/Lymphatic: Moderate aortic atherosclerosis. No aneurysm. No significantly enlarged lymph nodes. Reproductive: Enlarged prostate. Fluid-filled defect within the prostate which may be postsurgical. Other: No free air. Trace free fluid in the pelvis. Small fat containing supraumbilical ventral hernia.  Musculoskeletal: Degenerative changes. No acute or suspicious abnormality. IMPRESSION: 1. Dilated gallbladder with multiple calcified stones and suspected pericholecystic fluid and mild inflammatory changes raising concern for an acute cholecystitis. Suggest correlation with ultrasound. There is perihepatic fluid, which may be secondary to inflammatory process from the gallbladder. 2. Small right pleural effusion and partial atelectasis at the right base. 3. Bilateral parapelvic renal cysts 4. Small fat containing supraumbilical ventral hernia Electronically Signed   By: Jasmine Pang M.D.   On: 12/25/2018 22:49    ROS:  Pertinent items are noted in HPI.  Blood pressure (!) 129/94, pulse 72, temperature 97.7 F (36.5 C), temperature source Oral, resp. rate 18, height 5\' 11"  (1.803 m), weight 80.7 kg, SpO2 96 %. Physical Exam: Pleasant white Eaton no acute distress Head is normocephalic, atraumatic Eyes are without scleral icterus Lungs clear to auscultation with good breath sounds bilaterally Heart examination reveals a regular rate and rhythm without S3, S4, murmurs Abdomen is soft with minimal tenderness in the right upper quadrant to palpation.  Umbilical hernia is present.  No hepatosplenomegaly, masses, or rigidity are noted.  CT scan images personally reviewed  Assessment/Plan: Impression: Acute cholecystitis, cholelithiasis History of black tarry stool stools, GI consultation pending Alzheimer's dementia, diabetes mellitus Plan: I did offer surgical intervention for his cholecystitis, the patient's wife who has power of  attorney would like to try nonoperative intervention.  As the patient seems to be improving on IV antibiotics, will try this route.  She does fully realize that if he worsens, surgical intervention or placement of cholecystostomy tube by IR may be indicated.  May be on clear liquid diet once patient is seen by gastroenterology.  Franky Macho 12/26/2018, 7:52 AM

## 2018-12-26 NOTE — Progress Notes (Signed)
PROGRESS NOTE    Richard Eaton  XBM:[id number] DOB: [birth year] DOA: 12/25/2018 PCP: Kela MillinBarrino, Richard Eaton    Brief Narrative:  78 year old male with history of dementia, hypertension and diet-controlled diabetes, presented to the hospital with severe right upper quadrant pain, nausea and vomiting for approximately 5 days.  He also had dark tarry stools for 2 days prior to admission.  Work-up in the emergency room indicated possible acute cholecystitis.  He was started on intravenous antibiotics and seen by general surgery.  He was also seen by GI for melena and plans are for EGD on 2/19.   Assessment & Plan:   Principal Problem:   Acute cholecystitis Active Problems:   Alzheimer disease (HCC)   Hypertension   Diet-controlled diabetes mellitus (HCC)   Melena   Hypokalemia   1. Acute cholecystitis.  Seen by general surgery.  Family wishes to hold off on operative management for now.  We will continue with intravenous antibiotics.  Continue bowel rest. 2. Melena.  Patient was having dark tarry stools prior to admission.  BUN was elevated.  Continue to follow serial hemoglobins.  GI following and plans on EGD in a.m. 3. Diabetes.  Diet controlled at home.  A1c 7.4.  Continue to follow blood sugars with sliding scale insulin. 4. Hypertension.  Blood pressure currently stable. 5. Dementia.  Continue on Richard Eaton.   DVT prophylaxis: SCDs Code Status: Full code Family Communication: Discussed with wife at the bedside Disposition Plan: Discharge home once improved   Consultants:   General surgery  Gastroenterology  Procedures:     Antimicrobials:   Zosyn 2/17 >   Subjective: Abdominal pain is doing better.  He has not had any further dark stools since arrival.  Objective: Vitals:   12/26/18 1230 12/26/18 1300 12/26/18 1330 12/26/18 1413  BP: (!) 129/92 (!) 154/36 139/62 137/66  Pulse:    65  Resp:    20  Temp:    98.7 F (37.1 C)  TempSrc:    Oral  SpO2:    95%    Weight:    78.7 kg  Height:    5\' 11"  (1.803 m)    Intake/Output Summary (Last 24 hours) at 12/26/2018 1755 Last data filed at 12/26/2018 1523 Gross per 24 hour  Intake 729.23 ml  Output -  Net 729.23 ml   Filed Weights   12/25/18 1602 12/26/18 1413  Weight: 80.7 kg 78.7 kg    Examination:  General exam: Appears calm and comfortable  Respiratory system: Clear to auscultation. Respiratory effort normal. Cardiovascular system: S1 & S2 heard, RRR. No JVD, murmurs, rubs, gallops or clicks. No pedal edema. Gastrointestinal system: Abdomen is nondistended, soft and tender in right upper quadrant.  Periumbilical hernia is present, soft, reducible.  No organomegaly or masses felt. Normal bowel sounds heard. Central nervous system: Alert and oriented. No focal neurological deficits. Extremities: Symmetric 5 x 5 power. Skin: No rashes, lesions or ulcers Psychiatry: Judgement and insight appear normal. Mood & affect appropriate.     Data Reviewed: I have personally reviewed following labs and imaging studies  CBC: Recent Labs  Lab 12/25/18 1621 12/26/18 0539  WBC 18.8* 16.1*  NEUTROABS  --  13.0*  HGB 11.9* 10.6*  HCT 35.8* 32.6*  MCV 93.5 93.9  PLT 349 266   Basic Metabolic Panel: Recent Labs  Lab 12/25/18 1621 12/26/18 0539  NA 137 137  K 3.3* 3.3*  CL 101 106  CO2 24 21*  GLUCOSE 208* 148*  BUN  54* 52*  CREATININE 1.08 0.99  CALCIUM 9.2 8.5*  MG  --  2.2   GFR: Estimated Creatinine Clearance: 67.6 mL/min (by C-G formula based on SCr of 0.99 mg/dL). Liver Function Tests: Recent Labs  Lab 12/25/18 1621 12/26/18 0539  AST 112* 52*  ALT 101* 73*  ALKPHOS 88 72  BILITOT 1.2 0.6  PROT 7.5 6.2*  ALBUMIN 3.1* 2.6*   Recent Labs  Lab 12/25/18 1621 12/26/18 0539  LIPASE 15 14   No results for input(s): AMMONIA in the last 168 hours. Coagulation Profile: Recent Labs  Lab 12/25/18 1621  INR 1.20   Cardiac Enzymes: No results for input(s): CKTOTAL,  CKMB, CKMBINDEX, TROPONINI in the last 168 hours. BNP (last 3 results) No results for input(s): PROBNP in the last 8760 hours. HbA1C: No results for input(s): HGBA1C in the last 72 hours. CBG: Recent Labs  Lab 12/26/18 0036 12/26/18 0421 12/26/18 0846 12/26/18 1222 12/26/18 1609  GLUCAP 181* 142* 130* 145* 164*   Lipid Profile: No results for input(s): CHOL, HDL, LDLCALC, TRIG, CHOLHDL, LDLDIRECT in the last 72 hours. Thyroid Function Tests: No results for input(s): TSH, T4TOTAL, FREET4, T3FREE, THYROIDAB in the last 72 hours. Anemia Panel: No results for input(s): VITAMINB12, FOLATE, FERRITIN, TIBC, IRON, RETICCTPCT in the last 72 hours. Sepsis Labs: No results for input(s): PROCALCITON, LATICACIDVEN in the last 168 hours.  No results found for this or any previous visit (from the past 240 hour(s)).       Radiology Studies: Ct Abdomen Pelvis W Contrast  Result Date: 12/25/2018 CLINICAL DATA:  Abdominal pain with fever EXAM: CT ABDOMEN AND PELVIS WITH CONTRAST TECHNIQUE: Multidetector CT imaging of the abdomen and pelvis was performed using the standard protocol following bolus administration of intravenous contrast. CONTRAST:  OMNIPAQUE IOHEXOL 300 MG/ML  SOLN COMPARISON:  None. FINDINGS: Lower chest: Small right-sided pleural effusion. Partial atelectasis in the right lower lobe. The heart size is within normal limits. Small hiatal hernia. Hepatobiliary: Distended gallbladder with multiple calcified stones. Mild soft tissue stranding in the right upper quadrant adjacent to the gallbladder. Perihepatic fluid. Probable cyst at the porta hepatis. No definite biliary enlargement. Pancreas: Unremarkable. No pancreatic ductal dilatation or surrounding inflammatory changes. Spleen: Normal in size without focal abnormality. Adrenals/Urinary Tract: Adrenal glands are normal. Parapelvic cysts. No hydronephrosis. Air within the bladder. Possible small gas containing diverticulum off the  anterior wall of the bladder. Stomach/Bowel: Stomach is nonenlarged. No dilated small bowel. No colon wall thickening. Negative appendix. Sigmoid colon diverticula. Vascular/Lymphatic: Moderate aortic atherosclerosis. No aneurysm. No significantly enlarged lymph nodes. Reproductive: Enlarged prostate. Fluid-filled defect within the prostate which may be postsurgical. Other: No free air. Trace free fluid in the pelvis. Small fat containing supraumbilical ventral hernia. Musculoskeletal: Degenerative changes. No acute or suspicious abnormality. IMPRESSION: 1. Dilated gallbladder with multiple calcified stones and suspected pericholecystic fluid and mild inflammatory changes raising concern for an acute cholecystitis. Suggest correlation with ultrasound. There is perihepatic fluid, which may be secondary to inflammatory process from the gallbladder. 2. Small right pleural effusion and partial atelectasis at the right base. 3. Bilateral parapelvic renal cysts 4. Small fat containing supraumbilical ventral hernia Electronically Signed   By: Jasmine Pang M.D.   On: 12/25/2018 22:49   US Abdomen Limited Ruq  Result Date: 12/26/2018 CLINICAL DATA:  Right upper quadrant pain.  Abnormal LFTs. EXAM: ULTRASOUND ABDOMEN LIMITED RIGHT UPPER QUADRANT COMPARISON:  CT abdomen and pelvis 12/25/2018 FINDINGS: Gallbladder: Numerous small stones in the gallbladder without  a dominant, discretely measurable stone. Mild gallbladder wall thickening measuring 4 mm. Pericholecystic fluid. No sonographic Murphy sign noted by sonographer. Common bile duct: Diameter: 4 mm Liver: Mildly increased parenchymal echogenicity diffusely with mildly nodular liver contour. No focal lesion identified. Small volume perihepatic free fluid. Portal vein is patent on color Doppler imaging with normal direction of blood flow towards the liver. IMPRESSION: 1. Numerous small gallstones with mild gallbladder wall thickening. This may reflect acute  cholecystitis in the appropriate clinical setting (particularly given the gallbladder distension and regional inflammatory changes on CT) although there was no sonographic Murphy sign. Nuclear medicine HIDA scan could be considered to evaluate cystic duct patency of clinically indicated. 2. Mildly echogenic liver, nonspecific though may reflect steatosis or chronic hepatitis with early cirrhosis possible given mildly nodular liver contour. Chronic liver disease could also contribute to gallbladder wall thickening. 3. No biliary dilatation. Electronically Signed   By: Sebastian Ache M.D.   On: 12/26/2018 09:27        Scheduled Meds: . feeding supplement  1 Container Oral TID BM  . insulin aspart  0-9 Units Subcutaneous Q4H  . pantoprazole (PROTONIX) IV  40 mg Intravenous Q12H  . sodium chloride flush  3 mL Intravenous Once   Continuous Infusions: . piperacillin-tazobactam (ZOSYN)  IV       LOS: 1 day    Time spent:    Erick Blinks, E[facility]   If 7PM-7AM, please contact night-coverage [email or web address]  12/26/2018, 5:55 PM

## 2018-12-26 NOTE — Progress Notes (Signed)
Pharmacy Antibiotic Note  Richard Eaton is a 78 y.o. male admitted on 12/25/2018 with intra-abdominal infection.  Pharmacy has been consulted for zosyn dosing.  Plan: Zosyn 3.375g IV q8h (4 hour infusion).  F/U cxs and clinical progress Monitor V/S, labs  Height: 5\' 11"  (180.3 cm) Weight: 178 lb (80.7 kg) IBW/kg (Calculated) : 75.3  Temp (24hrs), Avg:97.7 F (36.5 C), Min:97.7 F (36.5 C), Max:97.7 F (36.5 C)  Recent Labs  Lab 12/25/18 1621 12/26/18 0539  WBC 18.8* 16.1*  CREATININE 1.08 0.99    Estimated Creatinine Clearance: 67.6 mL/min (by C-G formula based on SCr of 0.99 mg/dL).    No Known Allergies  Antimicrobials this admission: Zosyn 2/17 >>   Dose adjustments this admission: n/a  Microbiology results: No cultures  Thank you for allowing pharmacy to be a part of this patient's care.  Elder Cyphers, BS Loura Back, [place] Clinical Pharmacist Pager [phone] 12/26/2018 8:33 AM

## 2018-12-26 NOTE — H&P (View-Only) (Signed)
Referring Provider: Dr. Mellody Memos ED  Primary Care Physician:  Sandi Mealy, MD Primary Gastroenterologist:  Dr. Oneida Alar   Date of Admission: 12/25/18 Date of Consultation: 12/26/18  Reason for Consultation:  Black, tarry stool reported, heme negative   HPI:  Richard Eaton is a 78 y.o. year old male presenting with abdominal pain for the past week, associated nausea and vomiting, weakness, and reports of black, tarry stool per wife. Afebrile on admission and found to have elevated transaminases, leukocytosis with WBC count 18.8, and CT abd/pelvis with contrast on admission with findings concerning for acute cholecystitis. He is heme negative without overt GI bleeding this admission.  Outpatient labs reviewed from [facility], with Hgb 13.8 as outpatient in early Feb 2020. LFTs also normal at that time with Tbili 0.8, Alk Phos 78, Ast 17, ALT 10, creatinine 0.99, BUN 17. Admitting Hgb 11.9. BUN disproportionately elevated at 52. Dr. Arnoldo Morale has offered surgery for cholecystitis, but wife is requesting non-operative measures currently.   Wife is caretaker of patient, who has dementia. She notes RUQ pain, right shoulder pain starting last Thursday, associated N/V. Ate pizza on Wednesday evening, then woke up with severe pain. Noted black, tarry stool X 3 episodes starting 2 days ago. Last evidence of black stool was Sunday. No BM yesterday. He is incontinent of bowel and bladder. She denies any intake of iron, pepto bismol, etc. He takes an 81 mg aspirin daily, and she was giving him Ibuprofen alternating tylenol at onset of pain and moreso over the weekend. No chronic upper GI concerns. Rare indigestion, for which he will take Tums. No PPI. Prior to Thanksgiving, he had decreased appetite and lost about 20 lbs. Wife states as of last week he is within 2 lbs of his baseline weight. No dysphagia. No prior colonoscopy or endoscopy.  Wife provides information due to patient's history of  dementia. Patient denying any abdominal pain or nausea currently. Wants to drink. Patient and wife have been married 54 years, and she states he has not received routine primary care until several years ago, as he never had significant health issues until 2017. He requires gait belt for assistance with transferring from bed to chair. Multiple falls at home per wife. 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   Past Medical History:  Diagnosis Date  . Alzheimer disease ([place]) 11/28/2018  . Bilateral foot-drop   . Congestive heart failure (CHF) ([place])   . Dementia ([place])   . Diabetes ([place])   . Diabetic peripheral neuropathy ([place])   . Gait disorder   . Hypertension   . Peripheral neuropathy 11/28/2018  . Urinary incontinence     Past Surgical History:  Procedure Laterality Date  . None      Prior to Admission medications   Medication Sig Start Date End Date Taking? Authorizing Provider  acetaminophen (TYLENOL) 500 MG tablet Take 1,000 mg by mouth every 6 (six) hours as needed for mild pain or moderate pain.   Yes [provider]  amLODipine (NORVASC) 10 MG tablet Take 10 mg by mouth at bedtime.    Yes [provider]  aspirin EC 81 MG tablet Take 81 mg by mouth at bedtime.   Yes [provider]  ibuprofen (ADVIL,MOTRIN) 200 MG tablet Take 400 mg by mouth every 6 (six) hours as needed for mild pain or moderate pain.   Yes [provider]  losartan (COZAAR)  100 MG tablet Take 1 tablet by mouth at bedtime.  06/29/18  Yes [provider]  memantine (NAMENDA) 10 MG tablet Take 1 tablet (10 mg total) by mouth 2 (two) times daily. 11/28/18  Yes Kathrynn Ducking, MD  mirtazapine (REMERON) 15 MG tablet Take 15 mg by mouth at bedtime. 06/12/18  Yes [provider]  ondansetron (ZOFRAN) 4 MG tablet Take 4 mg by mouth every 12 (twelve) hours as needed for nausea or vomiting.  12/15/18  Yes [provider]  promethazine (PHENERGAN) 25 MG tablet Take 12.5-25 mg by mouth daily as needed for nausea or vomiting.  06/29/18  Yes [provider]  sertraline (ZOLOFT) 50 MG tablet One tablet daily for one week, then take 2 tablets daily Patient taking differently: Take 100 mg by mouth every morning.  11/28/18  Yes Kathrynn Ducking, MD  tamsulosin (FLOMAX) 0.4 MG CAPS capsule Take 0.4 mg by mouth at bedtime. 06/28/18  Yes [provider]  triamcinolone cream (KENALOG) 0.1 % Apply 1 application topically daily as needed (for skin irritation).  06/12/18 06/11/2019 Yes [provider]    Current Facility-Administered Medications  Medication Dose Route Frequency Provider Last Rate Last Dose  . 0.9 % NaCl with KCl 20 mEq/ L  infusion   Intravenous Continuous Opyd, Ilene Qua, MD 100 mL/hr at 12/26/18 0711    . acetaminophen (TYLENOL) tablet 650 mg  650 mg Oral Q6H PRN Opyd, Ilene Qua, MD       Or  . acetaminophen (TYLENOL) suppository 650 mg  650 mg Rectal Q6H PRN Opyd, Ilene Qua, MD      . fentaNYL (SUBLIMAZE) injection 25-50 mcg  25-50 mcg Intravenous Q2H PRN Opyd, Ilene Qua, MD   50 mcg at 12/26/18 0815  . hydrALAZINE (APRESOLINE) injection 10 mg  10 mg Intravenous Q4H PRN Opyd, Ilene Qua, MD      . insulin aspart (novoLOG) injection 0-9 Units  0-9 Units Subcutaneous Q4H Opyd, Ilene Qua, MD   1 Units at 12/26/18 0506  . ondansetron (ZOFRAN) tablet 4 mg  4 mg Oral Q6H PRN Opyd, Ilene Qua, MD       Or  . ondansetron (ZOFRAN) injection 4 mg  4 mg Intravenous Q6H PRN Opyd, Ilene Qua, MD   4 mg at 12/26/18 0848  . pantoprazole (PROTONIX) injection 40 mg  40 mg Intravenous Q12H Opyd, Ilene Qua, MD   40 mg at 12/26/18 0850  . piperacillin-tazobactam (ZOSYN) IVPB 3.375 g  3.375 g Intravenous Once Vianne Bulls, MD 12.5 mL/hr at 12/26/18 0852 3.375 g at 12/26/18 0852  . sodium chloride flush (NS) 0.9 % injection 3 mL  3 mL Intravenous Once Opyd, Ilene Qua, MD       Current  Outpatient Medications  Medication Sig Dispense Refill  . acetaminophen (TYLENOL) 500 MG tablet Take 1,000 mg by mouth every 6 (six) hours as needed for mild pain or moderate pain.    Marland Kitchen amLODipine (NORVASC) 10 MG tablet Take 10 mg by mouth at bedtime.     Marland Kitchen aspirin EC 81 MG tablet Take 81 mg by mouth at bedtime.    Marland Kitchen ibuprofen (ADVIL,MOTRIN) 200 MG tablet Take 400 mg by mouth every 6 (six) hours as needed for mild pain or moderate pain.    Marland Kitchen losartan (COZAAR) 100 MG tablet Take 1 tablet by mouth at bedtime.     . memantine (NAMENDA) 10 MG tablet Take 1 tablet (10 mg total) by mouth 2 (  two) times daily. 180 tablet 3  . mirtazapine (REMERON) 15 MG tablet Take 15 mg by mouth at bedtime.    . ondansetron (ZOFRAN) 4 MG tablet Take 4 mg by mouth every 12 (twelve) hours as needed for nausea or vomiting.     . promethazine (PHENERGAN) 25 MG tablet Take 12.5-25 mg by mouth daily as needed for nausea or vomiting.     . sertraline (ZOLOFT) 50 MG tablet One tablet daily for one week, then take 2 tablets daily (Patient taking differently: Take 100 mg by mouth every morning. ) 60 tablet 3  . tamsulosin (FLOMAX) 0.4 MG CAPS capsule Take 0.4 mg by mouth at bedtime.  11  . triamcinolone cream (KENALOG) 0.1 % Apply 1 application topically daily as needed (for skin irritation).       Allergies as of 12/25/2018  . (No Known Allergies)    Family History  Problem Relation Age of Onset  . Heart attack Father   . Colon cancer Neg Hx   . Colon polyps Neg Hx     Social History   Socioeconomic History  . Marital status: Married    Spouse name: Not on file  . Number of children: Not on file  . Years of education: Not on file  . Highest education level: Not on file  Occupational History  . Not on file  Social Needs  . Financial resource strain: Not on file  . Food insecurity:    Worry: Not on file    Inability: Not on file  . Transportation needs:    Medical: Not on file    Non-medical: Not on file   Tobacco Use  . Smoking status: Never Smoker  . Smokeless tobacco: Never Used  Substance and Sexual Activity  . Alcohol use: Never    Frequency: Never  . Drug use: Never  . Sexual activity: Not on file  Lifestyle  . Physical activity:    Days per week: Not on file    Minutes per session: Not on file  . Stress: Not on file  Relationships  . Social connections:    Talks on phone: Not on file    Gets together: Not on file    Attends religious service: Not on file    Active member of club or organization: Not on file    Attends meetings of clubs or organizations: Not on file    Relationship status: Not on file  . Intimate partner violence:    Fear of current or ex partner: Not on file    Emotionally abused: Not on file    Physically abused: Not on file    Forced sexual activity: Not on file  Other Topics Concern  . Not on file  Social History Narrative   Lives at home with wife, who is sole caretaker.          Caffeine use: Drinks 1 cup coffee per Eaton   Right handed     Review of Systems: Limited due to cognitive deficits   Physical Exam: Vital signs in last 24 hours: Temp:  [97.7 F (36.5 C)-98.1 F (36.7 C)] 98.1 F (36.7 C) (02/18 0853) Pulse Rate:  [64-78] 64 (02/18 0853) Resp:  [16-18] 16 (02/18 0853) BP: (111-139)/(55-97) 139/58 (02/18 0853) SpO2:  [96 %] 96 % (02/18 0853) Weight:  [80.7 kg] 80.7 kg (02/17 1602)   General:   Alert to person,  Well-developed, well-nourished, pleasant and cooperative in NAD Head:  Normocephalic and atraumatic. Eyes:  Sclera clear, no icterus.   Conjunctiva pink. Ears:  Normal auditory acuity. Nose:  No deformity, discharge,  or lesions. Mouth:  No deformity or lesions Lungs:  Clear throughout to auscultation.   Heart:  S1 S2 present Abdomen:  Soft, mild TTP RUQ and nondistended. No masses, hepatosplenomegaly. Umbilical hernia present, soft.  Rectal:  Deferred   Msk:  Upper extremities symmetric without gross deformities.  Bilateral muscle wasting lower extremities  Extremities:  Without edema. Neurologic:  Alert and  oriented to person only. Believes the year is 2001. Unknown city but knows he lives in [place] and is in [place].  Skin:  Intact without significant lesions or rashes. Psych:  Alert and cooperative. Normal mood and affect.  Intake/Output from previous Eaton: No intake/output data recorded. Intake/Output this shift: No intake/output data recorded.  Lab Results: Recent Labs    12/25/18 1621 12/26/18 0539  WBC 18.8* 16.1*  HGB 11.9* 10.6*  HCT 35.8* 32.6*  PLT 349 266   BMET Recent Labs    12/25/18 1621 12/26/18 0539  NA 137 137  K 3.3* 3.3*  CL 101 106  CO2 24 21*  GLUCOSE 208* 148*  BUN 54* 52*  CREATININE 1.08 0.99  CALCIUM 9.2 8.5*   LFT Recent Labs    12/25/18 1621 12/26/18 0539  PROT 7.5 6.2*  ALBUMIN 3.1* 2.6*  AST 112* 52*  ALT 101* 73*  ALKPHOS 88 72  BILITOT 1.2 0.6   PT/INR Recent Labs    12/25/18 1621  LABPROT 15.1  INR 1.20    Studies/Results: Ct Abdomen Pelvis W Contrast  Result Date: 12/25/2018 CLINICAL DATA:  Abdominal pain with fever EXAM: CT ABDOMEN AND PELVIS WITH CONTRAST TECHNIQUE: Multidetector CT imaging of the abdomen and pelvis was performed using the standard protocol following bolus administration of intravenous contrast. CONTRAST:  163m OMNIPAQUE IOHEXOL 300 MG/ML  SOLN COMPARISON:  None. FINDINGS: Lower chest: Small right-sided pleural effusion. Partial atelectasis in the right lower lobe. The heart size is within normal limits. Small hiatal hernia. Hepatobiliary: Distended gallbladder with multiple calcified stones. Mild soft tissue stranding in the right upper quadrant adjacent to the gallbladder. Perihepatic fluid. Probable cyst at the porta hepatis. No definite biliary enlargement. Pancreas: Unremarkable. No pancreatic ductal dilatation or surrounding inflammatory changes. Spleen: Normal in size without focal abnormality. Adrenals/Urinary  Tract: Adrenal glands are normal. Parapelvic cysts. No hydronephrosis. Air within the bladder. Possible small gas containing diverticulum off the anterior wall of the bladder. Stomach/Bowel: Stomach is nonenlarged. No dilated small bowel. No colon wall thickening. Negative appendix. Sigmoid colon diverticula. Vascular/Lymphatic: Moderate aortic atherosclerosis. No aneurysm. No significantly enlarged lymph nodes. Reproductive: Enlarged prostate. Fluid-filled defect within the prostate which may be postsurgical. Other: No free air. Trace free fluid in the pelvis. Small fat containing supraumbilical ventral hernia. Musculoskeletal: Degenerative changes. No acute or suspicious abnormality. IMPRESSION: 1. Dilated gallbladder with multiple calcified stones and suspected pericholecystic fluid and mild inflammatory changes raising concern for an acute cholecystitis. Suggest correlation with ultrasound. There is perihepatic fluid, which may be secondary to inflammatory process from the gallbladder. 2. Small right pleural effusion and partial atelectasis at the right base. 3. Bilateral parapelvic renal cysts 4. Small fat containing supraumbilical ventral hernia Electronically Signed   By: KDonavan FoilM.D.   On: 12/25/2018 22:49   UKoreaAbdomen Limited Ruq  Result Date: 12/26/2018 CLINICAL DATA:  Right upper quadrant pain.  Abnormal LFTs. EXAM: ULTRASOUND ABDOMEN LIMITED RIGHT UPPER QUADRANT COMPARISON:  CT abdomen and pelvis 12/25/2018  FINDINGS: Gallbladder: Numerous small stones in the gallbladder without a dominant, discretely measurable stone. Mild gallbladder wall thickening measuring 4 mm. Pericholecystic fluid. No sonographic Murphy sign noted by sonographer. Common bile duct: Diameter: 4 mm Liver: Mildly increased parenchymal echogenicity diffusely with mildly nodular liver contour. No focal lesion identified. Small volume perihepatic free fluid. Portal vein is patent on color Doppler imaging with normal direction  of blood flow towards the liver. IMPRESSION: 1. Numerous small gallstones with mild gallbladder wall thickening. This may reflect acute cholecystitis in the appropriate clinical setting (particularly given the gallbladder distension and regional inflammatory changes on CT) although there was no sonographic Murphy sign. Nuclear medicine HIDA scan could be considered to evaluate cystic duct patency of clinically indicated. 2. Mildly echogenic liver, nonspecific though may reflect steatosis or chronic hepatitis with early cirrhosis possible given mildly nodular liver contour. Chronic liver disease could also contribute to gallbladder wall thickening. 3. No biliary dilatation. Electronically Signed   By: Logan Bores M.D.   On: 12/26/2018 09:27    Impression: 78 year old male with RUQ pain, nausea, vomiting, and reports of fever as outpatient and admitted with acute cholecystitis, clinically improved with IV antibiotics and supportive measures. General Surgery has assessed patient, and wife is requesting non-operative measures currently. Reports of possible melena per wife starting two days ago, with last evidence on Sunday. No overt GI bleeding since admission, and he is heme negative.   Normocytic anemia: multifactorial. Baseline Hgb 13.8 early Feb 2020 noted in [place]. Hgb 10.6 today. No overt GI bleeding since admission, and he is heme negative. Concern for upper GI source in setting of NSAIDs, most frequently over weekend taking Ibuprofen for acute pain. No chronic PPI. Would benefit from EGD once clinically appropriate.   Elevated LFTs: in setting of cholecystitis. Documented normal HFP early Feb 2020. US abdomen reviewed noting mildly echogenic liver. Favor fatty liver in this setting; mildly nodular liver contour noted but low concern for advanced liver disease in setting of normal platelets, normal INR, no splenomegaly on imaging. Surgery following with IV antibiotics on board. Clinically  improving.   Plan: Follow clinically, monitor for any further overt GI bleeding Follow H/H Continue PPI IV BID Non-operative intervention for cholecystitis per family request EGD once clinically appropriate    Annitta Needs, PhD, ANP-BC [facility]      LOS: 1 Eaton    12/26/2018, 9:31 AM

## 2018-12-26 NOTE — Progress Notes (Signed)
ANTIBIOTIC CONSULT NOTE-Preliminary  Pharmacy Consult for Zosyn Indication: Intra-abdominal infection  No Known Allergies  Patient Measurements: Height: 5\' 11"  (180.3 cm) Weight: 178 lb (80.7 kg) IBW/kg (Calculated) : 75.3  Vital Signs: Temp: 97.7 F (36.5 C) (02/17 1602) Temp Source: Oral (02/17 1602) BP: 118/67 (02/17 2042) Pulse Rate: 72 (02/17 2043)  Labs: Recent Labs    12/25/18 1621  WBC 18.8*  HGB 11.9*  PLT 349  CREATININE 1.08    Estimated Creatinine Clearance: 62 mL/min (by C-G formula based on SCr of 1.08 mg/dL).  No results for input(s): VANCOTROUGH, VANCOPEAK, VANCORANDOM, GENTTROUGH, GENTPEAK, GENTRANDOM, TOBRATROUGH, TOBRAPEAK, TOBRARND, AMIKACINPEAK, AMIKACINTROU, AMIKACIN in the last 72 hours.   Microbiology: No results found for this or any previous visit (from the past 720 hour(s)).  Medical History: Past Medical History:  Diagnosis Date  . Alzheimer disease ([facility]) 11/28/2018  . Bilateral foot-drop   . Congestive heart failure (CHF) ([facility])   . Dementia ([facility])   . Diabetes ([facility])   . Diabetic peripheral neuropathy ([facility])   . Gait disorder   . Hypertension   . Peripheral neuropathy 11/28/2018  . Urinary incontinence     Medications:  Zosyn 3.375 Gm IV x 1 dose ordered by the EDP  Assessment: 78 yo male presents to the ED with a 5 day history of right upper quadrant pain. Pharmacy has been consulted for Zosyn dosing.  Goal of Therapy:  Eradicate infection  Plan:  Preliminary review of pertinent patient information completed.  Protocol will be initiated with a dose of Zosyn 3.375 Gm IV @ 8 hours after the initial Ed dose.Pattricia Boss [facility] clinical pharmacist will complete review during morning rounds to assess patient and finalize treatment regimen if needed.  Arelia Sneddon, [facility] 12/26/2018,12:12 AM

## 2018-12-26 NOTE — ED Notes (Signed)
Spoke to pt wife about need for urine sample. Wife says she will call when she thinks pt can provide sample.

## 2018-12-26 NOTE — Consult Note (Signed)
Referring Provider: Dr. Mellody Memos ED  Primary Care Physician:  Sandi Mealy, MD Primary Gastroenterologist:  Dr. Oneida Alar   Date of Admission: 12/25/18 Date of Consultation: 12/26/18  Reason for Consultation:  Black, tarry stool reported, heme negative   HPI:  Richard Eaton is a 78 y.o. year old male presenting with abdominal pain for the past week, associated nausea and vomiting, weakness, and reports of black, tarry stool per wife. Afebrile on admission and found to have elevated transaminases, leukocytosis with WBC count 18.8, and CT abd/pelvis with contrast on admission with findings concerning for acute cholecystitis. He is heme negative without overt GI bleeding this admission.  Outpatient labs reviewed from [facility], with Hgb 13.8 as outpatient in early Feb 2020. LFTs also normal at that time with Tbili 0.8, Alk Phos 78, Ast 17, ALT 10, creatinine 0.99, BUN 17. Admitting Hgb 11.9. BUN disproportionately elevated at 52. Dr. Arnoldo Morale has offered surgery for cholecystitis, but wife is requesting non-operative measures currently.   Wife is caretaker of patient, who has dementia. She notes RUQ pain, right shoulder pain starting last Thursday, associated N/V. Ate pizza on Wednesday evening, then woke up with severe pain. Noted black, tarry stool X 3 episodes starting 2 days ago. Last evidence of black stool was Sunday. No BM yesterday. He is incontinent of bowel and bladder. She denies any intake of iron, pepto bismol, etc. He takes an 81 mg aspirin daily, and she was giving him Ibuprofen alternating tylenol at onset of pain and moreso over the weekend. No chronic upper GI concerns. Rare indigestion, for which he will take Tums. No PPI. Prior to Thanksgiving, he had decreased appetite and lost about 20 lbs. Wife states as of last week he is within 2 lbs of his baseline weight. No dysphagia. No prior colonoscopy or endoscopy.  Wife provides information due to patient's history of  dementia. Patient denying any abdominal pain or nausea currently. Wants to drink. Patient and wife have been married 54 years, and she states he has not received routine primary care until several years ago, as he never had significant health issues until 2017. He requires gait belt for assistance with transferring from bed to chair. Multiple falls at home per wife. 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   Past Medical History:  Diagnosis Date  . Alzheimer disease ([place]) 11/28/2018  . Bilateral foot-drop   . Congestive heart failure (CHF) ([place])   . Dementia ([place])   . Diabetes ([place])   . Diabetic peripheral neuropathy ([place])   . Gait disorder   . Hypertension   . Peripheral neuropathy 11/28/2018  . Urinary incontinence     Past Surgical History:  Procedure Laterality Date  . None      Prior to Admission medications   Medication Sig Start Date End Date Taking? Authorizing Provider  acetaminophen (TYLENOL) 500 MG tablet Take 1,000 mg by mouth every 6 (six) hours as needed for mild pain or moderate pain.   Yes [provider]  amLODipine (NORVASC) 10 MG tablet Take 10 mg by mouth at bedtime.    Yes [provider]  aspirin EC 81 MG tablet Take 81 mg by mouth at bedtime.   Yes [provider]  ibuprofen (ADVIL,MOTRIN) 200 MG tablet Take 400 mg by mouth every 6 (six) hours as needed for mild pain or moderate pain.   Yes [provider]  losartan (COZAAR)  100 MG tablet Take 1 tablet by mouth at bedtime.  06/29/18  Yes [provider]  memantine (NAMENDA) 10 MG tablet Take 1 tablet (10 mg total) by mouth 2 (two) times daily. 11/28/18  Yes Kathrynn Ducking, MD  mirtazapine (REMERON) 15 MG tablet Take 15 mg by mouth at bedtime. 06/12/18  Yes [provider]  ondansetron (ZOFRAN) 4 MG tablet Take 4 mg by mouth every 12 (twelve) hours as needed for nausea or vomiting.  12/15/18  Yes [provider]  promethazine (PHENERGAN) 25 MG tablet Take 12.5-25 mg by mouth daily as needed for nausea or vomiting.  06/29/18  Yes [provider]  sertraline (ZOLOFT) 50 MG tablet One tablet daily for one week, then take 2 tablets daily Patient taking differently: Take 100 mg by mouth every morning.  11/28/18  Yes Kathrynn Ducking, MD  tamsulosin (FLOMAX) 0.4 MG CAPS capsule Take 0.4 mg by mouth at bedtime. 06/28/18  Yes [provider]  triamcinolone cream (KENALOG) 0.1 % Apply 1 application topically daily as needed (for skin irritation).  06/12/18 06/21/2019 Yes [provider]    Current Facility-Administered Medications  Medication Dose Route Frequency Provider Last Rate Last Dose  . 0.9 % NaCl with KCl 20 mEq/ L  infusion   Intravenous Continuous Opyd, Ilene Qua, MD 100 mL/hr at 12/26/18 0711    . acetaminophen (TYLENOL) tablet 650 mg  650 mg Oral Q6H PRN Opyd, Ilene Qua, MD       Or  . acetaminophen (TYLENOL) suppository 650 mg  650 mg Rectal Q6H PRN Opyd, Ilene Qua, MD      . fentaNYL (SUBLIMAZE) injection 25-50 mcg  25-50 mcg Intravenous Q2H PRN Opyd, Ilene Qua, MD   50 mcg at 12/26/18 0815  . hydrALAZINE (APRESOLINE) injection 10 mg  10 mg Intravenous Q4H PRN Opyd, Ilene Qua, MD      . insulin aspart (novoLOG) injection 0-9 Units  0-9 Units Subcutaneous Q4H Opyd, Ilene Qua, MD   1 Units at 12/26/18 0506  . ondansetron (ZOFRAN) tablet 4 mg  4 mg Oral Q6H PRN Opyd, Ilene Qua, MD       Or  . ondansetron (ZOFRAN) injection 4 mg  4 mg Intravenous Q6H PRN Opyd, Ilene Qua, MD   4 mg at 12/26/18 0848  . pantoprazole (PROTONIX) injection 40 mg  40 mg Intravenous Q12H Opyd, Ilene Qua, MD   40 mg at 12/26/18 0850  . piperacillin-tazobactam (ZOSYN) IVPB 3.375 g  3.375 g Intravenous Once Vianne Bulls, MD 12.5 mL/hr at 12/26/18 0852 3.375 g at 12/26/18 0852  . sodium chloride flush (NS) 0.9 % injection 3 mL  3 mL Intravenous Once Opyd, Ilene Qua, MD       Current  Outpatient Medications  Medication Sig Dispense Refill  . acetaminophen (TYLENOL) 500 MG tablet Take 1,000 mg by mouth every 6 (six) hours as needed for mild pain or moderate pain.    Marland Kitchen amLODipine (NORVASC) 10 MG tablet Take 10 mg by mouth at bedtime.     Marland Kitchen aspirin EC 81 MG tablet Take 81 mg by mouth at bedtime.    Marland Kitchen ibuprofen (ADVIL,MOTRIN) 200 MG tablet Take 400 mg by mouth every 6 (six) hours as needed for mild pain or moderate pain.    Marland Kitchen losartan (COZAAR) 100 MG tablet Take 1 tablet by mouth at bedtime.     . memantine (NAMENDA) 10 MG tablet Take 1 tablet (10 mg total) by mouth 2 (  two) times daily. 180 tablet 3  . mirtazapine (REMERON) 15 MG tablet Take 15 mg by mouth at bedtime.    . ondansetron (ZOFRAN) 4 MG tablet Take 4 mg by mouth every 12 (twelve) hours as needed for nausea or vomiting.     . promethazine (PHENERGAN) 25 MG tablet Take 12.5-25 mg by mouth daily as needed for nausea or vomiting.     . sertraline (ZOLOFT) 50 MG tablet One tablet daily for one week, then take 2 tablets daily (Patient taking differently: Take 100 mg by mouth every morning. ) 60 tablet 3  . tamsulosin (FLOMAX) 0.4 MG CAPS capsule Take 0.4 mg by mouth at bedtime.  11  . triamcinolone cream (KENALOG) 0.1 % Apply 1 application topically daily as needed (for skin irritation).       Allergies as of 12/25/2018  . (No Known Allergies)    Family History  Problem Relation Age of Onset  . Heart attack Father   . Colon cancer Neg Hx   . Colon polyps Neg Hx     Social History   Socioeconomic History  . Marital status: Married    Spouse name: Not on file  . Number of children: Not on file  . Years of education: Not on file  . Highest education level: Not on file  Occupational History  . Not on file  Social Needs  . Financial resource strain: Not on file  . Food insecurity:    Worry: Not on file    Inability: Not on file  . Transportation needs:    Medical: Not on file    Non-medical: Not on file   Tobacco Use  . Smoking status: Never Smoker  . Smokeless tobacco: Never Used  Substance and Sexual Activity  . Alcohol use: Never    Frequency: Never  . Drug use: Never  . Sexual activity: Not on file  Lifestyle  . Physical activity:    Days per week: Not on file    Minutes per session: Not on file  . Stress: Not on file  Relationships  . Social connections:    Talks on phone: Not on file    Gets together: Not on file    Attends religious service: Not on file    Active member of club or organization: Not on file    Attends meetings of clubs or organizations: Not on file    Relationship status: Not on file  . Intimate partner violence:    Fear of current or ex partner: Not on file    Emotionally abused: Not on file    Physically abused: Not on file    Forced sexual activity: Not on file  Other Topics Concern  . Not on file  Social History Narrative   Lives at home with wife, who is sole caretaker.          Caffeine use: Drinks 1 cup coffee per Eaton   Right handed     Review of Systems: Limited due to cognitive deficits   Physical Exam: Vital signs in last 24 hours: Temp:  [97.7 F (36.5 C)-98.1 F (36.7 C)] 98.1 F (36.7 C) (02/18 0853) Pulse Rate:  [64-78] 64 (02/18 0853) Resp:  [16-18] 16 (02/18 0853) BP: (111-139)/(55-97) 139/58 (02/18 0853) SpO2:  [96 %] 96 % (02/18 0853) Weight:  [80.7 kg] 80.7 kg (02/17 1602)   General:   Alert to person,  Well-developed, well-nourished, pleasant and cooperative in NAD Head:  Normocephalic and atraumatic. Eyes:  Sclera clear, no icterus.   Conjunctiva pink. Ears:  Normal auditory acuity. Nose:  No deformity, discharge,  or lesions. Mouth:  No deformity or lesions Lungs:  Clear throughout to auscultation.   Heart:  S1 S2 present Abdomen:  Soft, mild TTP RUQ and nondistended. No masses, hepatosplenomegaly. Umbilical hernia present, soft.  Rectal:  Deferred   Msk:  Upper extremities symmetric without gross deformities.  Bilateral muscle wasting lower extremities  Extremities:  Without edema. Neurologic:  Alert and  oriented to person only. Believes the year is 2001. Unknown city but knows he lives in [place] and is in [place].  Skin:  Intact without significant lesions or rashes. Psych:  Alert and cooperative. Normal mood and affect.  Intake/Output from previous Eaton: No intake/output data recorded. Intake/Output this shift: No intake/output data recorded.  Lab Results: Recent Labs    12/25/18 1621 12/26/18 0539  WBC 18.8* 16.1*  HGB 11.9* 10.6*  HCT 35.8* 32.6*  PLT 349 266   BMET Recent Labs    12/25/18 1621 12/26/18 0539  NA 137 137  K 3.3* 3.3*  CL 101 106  CO2 24 21*  GLUCOSE 208* 148*  BUN 54* 52*  CREATININE 1.08 0.99  CALCIUM 9.2 8.5*   LFT Recent Labs    12/25/18 1621 12/26/18 0539  PROT 7.5 6.2*  ALBUMIN 3.1* 2.6*  AST 112* 52*  ALT 101* 73*  ALKPHOS 88 72  BILITOT 1.2 0.6   PT/INR Recent Labs    12/25/18 1621  LABPROT 15.1  INR 1.20    Studies/Results: Ct Abdomen Pelvis W Contrast  Result Date: 12/25/2018 CLINICAL DATA:  Abdominal pain with fever EXAM: CT ABDOMEN AND PELVIS WITH CONTRAST TECHNIQUE: Multidetector CT imaging of the abdomen and pelvis was performed using the standard protocol following bolus administration of intravenous contrast. CONTRAST:  163m OMNIPAQUE IOHEXOL 300 MG/ML  SOLN COMPARISON:  None. FINDINGS: Lower chest: Small right-sided pleural effusion. Partial atelectasis in the right lower lobe. The heart size is within normal limits. Small hiatal hernia. Hepatobiliary: Distended gallbladder with multiple calcified stones. Mild soft tissue stranding in the right upper quadrant adjacent to the gallbladder. Perihepatic fluid. Probable cyst at the porta hepatis. No definite biliary enlargement. Pancreas: Unremarkable. No pancreatic ductal dilatation or surrounding inflammatory changes. Spleen: Normal in size without focal abnormality. Adrenals/Urinary  Tract: Adrenal glands are normal. Parapelvic cysts. No hydronephrosis. Air within the bladder. Possible small gas containing diverticulum off the anterior wall of the bladder. Stomach/Bowel: Stomach is nonenlarged. No dilated small bowel. No colon wall thickening. Negative appendix. Sigmoid colon diverticula. Vascular/Lymphatic: Moderate aortic atherosclerosis. No aneurysm. No significantly enlarged lymph nodes. Reproductive: Enlarged prostate. Fluid-filled defect within the prostate which may be postsurgical. Other: No free air. Trace free fluid in the pelvis. Small fat containing supraumbilical ventral hernia. Musculoskeletal: Degenerative changes. No acute or suspicious abnormality. IMPRESSION: 1. Dilated gallbladder with multiple calcified stones and suspected pericholecystic fluid and mild inflammatory changes raising concern for an acute cholecystitis. Suggest correlation with ultrasound. There is perihepatic fluid, which may be secondary to inflammatory process from the gallbladder. 2. Small right pleural effusion and partial atelectasis at the right base. 3. Bilateral parapelvic renal cysts 4. Small fat containing supraumbilical ventral hernia Electronically Signed   By: KDonavan FoilM.D.   On: 12/25/2018 22:49   UKoreaAbdomen Limited Ruq  Result Date: 12/26/2018 CLINICAL DATA:  Right upper quadrant pain.  Abnormal LFTs. EXAM: ULTRASOUND ABDOMEN LIMITED RIGHT UPPER QUADRANT COMPARISON:  CT abdomen and pelvis 12/25/2018  FINDINGS: Gallbladder: Numerous small stones in the gallbladder without a dominant, discretely measurable stone. Mild gallbladder wall thickening measuring 4 mm. Pericholecystic fluid. No sonographic Murphy sign noted by sonographer. Common bile duct: Diameter: 4 mm Liver: Mildly increased parenchymal echogenicity diffusely with mildly nodular liver contour. No focal lesion identified. Small volume perihepatic free fluid. Portal vein is patent on color Doppler imaging with normal direction  of blood flow towards the liver. IMPRESSION: 1. Numerous small gallstones with mild gallbladder wall thickening. This may reflect acute cholecystitis in the appropriate clinical setting (particularly given the gallbladder distension and regional inflammatory changes on CT) although there was no sonographic Murphy sign. Nuclear medicine HIDA scan could be considered to evaluate cystic duct patency of clinically indicated. 2. Mildly echogenic liver, nonspecific though may reflect steatosis or chronic hepatitis with early cirrhosis possible given mildly nodular liver contour. Chronic liver disease could also contribute to gallbladder wall thickening. 3. No biliary dilatation. Electronically Signed   By: Logan Bores M.D.   On: 12/26/2018 09:27    Impression: 78 year old male with RUQ pain, nausea, vomiting, and reports of fever as outpatient and admitted with acute cholecystitis, clinically improved with IV antibiotics and supportive measures. General Surgery has assessed patient, and wife is requesting non-operative measures currently. Reports of possible melena per wife starting two days ago, with last evidence on Sunday. No overt GI bleeding since admission, and he is heme negative.   Normocytic anemia: multifactorial. Baseline Hgb 13.8 early Feb 2020 noted in [place]. Hgb 10.6 today. No overt GI bleeding since admission, and he is heme negative. Concern for upper GI source in setting of NSAIDs, most frequently over weekend taking Ibuprofen for acute pain. No chronic PPI. Would benefit from EGD once clinically appropriate.   Elevated LFTs: in setting of cholecystitis. Documented normal HFP early Feb 2020. US abdomen reviewed noting mildly echogenic liver. Favor fatty liver in this setting; mildly nodular liver contour noted but low concern for advanced liver disease in setting of normal platelets, normal INR, no splenomegaly on imaging. Surgery following with IV antibiotics on board. Clinically  improving.   Plan: Follow clinically, monitor for any further overt GI bleeding Follow H/H Continue PPI IV BID Non-operative intervention for cholecystitis per family request EGD once clinically appropriate    Annitta Needs, PhD, ANP-BC [facility]      LOS: 1 Eaton    12/26/2018, 9:31 AM

## 2018-12-27 ENCOUNTER — Encounter (HOSPITAL_COMMUNITY): Payer: Self-pay | Admitting: *Deleted

## 2018-12-27 ENCOUNTER — Inpatient Hospital Stay (HOSPITAL_COMMUNITY): Payer: Medicare Other | Admitting: Anesthesiology

## 2018-12-27 ENCOUNTER — Encounter (HOSPITAL_COMMUNITY)
Admission: EM | Disposition: A | Discharge status: Home Health | Payer: Self-pay | Source: Home / Self Care | Attending: Family Medicine

## 2018-12-27 DIAGNOSIS — K228 Other specified diseases of esophagus: Secondary | ICD-10-CM

## 2018-12-27 DIAGNOSIS — L8990 Pressure ulcer of unspecified site, unspecified stage: Secondary | ICD-10-CM

## 2018-12-27 DIAGNOSIS — K449 Diaphragmatic hernia without obstruction or gangrene: Secondary | ICD-10-CM

## 2018-12-27 HISTORY — PX: BIOPSY: SHX5522

## 2018-12-27 HISTORY — PX: ESOPHAGOGASTRODUODENOSCOPY (EGD) WITH PROPOFOL: SHX5813

## 2018-12-27 LAB — COMPREHENSIVE METABOLIC PANEL
ALBUMIN: 2.5 g/dL — AB (ref 3.5–5.0)
ALT: 64 U/L — ABNORMAL HIGH (ref 0–44)
ANION GAP: 9 (ref 5–15)
AST: 49 U/L — ABNORMAL HIGH (ref 15–41)
Alkaline Phosphatase: 97 U/L (ref 38–126)
BUN: 39 mg/dL — AB (ref 8–23)
CO2: 23 mmol/L (ref 22–32)
Calcium: 8.2 mg/dL — ABNORMAL LOW (ref 8.9–10.3)
Chloride: 106 mmol/L (ref 98–111)
Creatinine, Ser: 1.02 mg/dL (ref 0.61–1.24)
GFR calc Af Amer: >60 mL/min (ref >60)
GFR calc non Af Amer: >60 mL/min (ref >60)
GLUCOSE: 126 mg/dL — AB (ref 70–99)
Potassium: 3.2 mmol/L — ABNORMAL LOW (ref 3.5–5.1)
Sodium: 138 mmol/L (ref 135–145)
Total Bilirubin: 0.6 mg/dL (ref 0.3–1.2)
Total Protein: 6.2 g/dL — ABNORMAL LOW (ref 6.5–8.1)

## 2018-12-27 LAB — GLUCOSE, CAPILLARY
GLUCOSE-CAPILLARY: 145 mg/dL — AB (ref 70–99)
Glucose-Capillary: 129 mg/dL — ABNORMAL HIGH (ref 70–99)
Glucose-Capillary: 135 mg/dL — ABNORMAL HIGH (ref 70–99)
Glucose-Capillary: 138 mg/dL — ABNORMAL HIGH (ref 70–99)
Glucose-Capillary: 139 mg/dL — ABNORMAL HIGH (ref 70–99)
Glucose-Capillary: 145 mg/dL — ABNORMAL HIGH (ref 70–99)
Glucose-Capillary: 149 mg/dL — ABNORMAL HIGH (ref 70–99)
Glucose-Capillary: 223 mg/dL — ABNORMAL HIGH (ref 70–99)

## 2018-12-27 LAB — CBC
HEMATOCRIT: 30.2 % — AB (ref 39.0–52.0)
Hemoglobin: 9.8 g/dL — ABNORMAL LOW (ref 13.0–17.0)
MCH: 31 pg (ref 26.0–34.0)
MCHC: 32.5 g/dL (ref 30.0–36.0)
MCV: 95.6 fL (ref 80.0–100.0)
Platelets: 316 10*3/uL (ref 150–400)
RBC: 3.16 MIL/uL — ABNORMAL LOW (ref 4.22–5.81)
RDW: 12.6 % (ref 11.5–15.5)
WBC: 18.6 10*3/uL — ABNORMAL HIGH (ref 4.0–10.5)
nRBC: 0 % (ref 0.0–0.2)

## 2018-12-27 SURGERY — ESOPHAGOGASTRODUODENOSCOPY (EGD) WITH PROPOFOL
Anesthesia: General

## 2018-12-27 MED ORDER — LOSARTAN POTASSIUM 50 MG PO TABS
100.0000 mg | ORAL_TABLET | Freq: Every day | ORAL | Status: DC
  Administered 2018-12-27 – 2018-12-31 (×5): 100 mg via ORAL
  Filled 2018-12-27 (×5): qty 2

## 2018-12-27 MED ORDER — PROPOFOL 500 MG/50ML IV EMUL
INTRAVENOUS | Status: DC | PRN
  Administered 2018-12-27: 150 ug/kg/min via INTRAVENOUS

## 2018-12-27 MED ORDER — POTASSIUM CHLORIDE 10 MEQ/100ML IV SOLN
10.0000 meq | INTRAVENOUS | Status: AC
  Administered 2018-12-27 (×3): 10 meq via INTRAVENOUS
  Filled 2018-12-27: qty 100

## 2018-12-27 MED ORDER — PROPOFOL 10 MG/ML IV BOLUS
INTRAVENOUS | Status: DC | PRN
  Administered 2018-12-27: 30 mg via INTRAVENOUS

## 2018-12-27 MED ORDER — MEMANTINE HCL 10 MG PO TABS
10.0000 mg | ORAL_TABLET | Freq: Two times a day (BID) | ORAL | Status: DC
  Administered 2018-12-27 – 2019-01-01 (×10): 10 mg via ORAL
  Filled 2018-12-27 (×10): qty 1

## 2018-12-27 MED ORDER — MORPHINE SULFATE (PF) 2 MG/ML IV SOLN
2.0000 mg | INTRAVENOUS | Status: DC | PRN
  Administered 2018-12-27 – 2018-12-28 (×3): 2 mg via INTRAVENOUS
  Filled 2018-12-27 (×3): qty 1

## 2018-12-27 MED ORDER — PROPOFOL 10 MG/ML IV BOLUS
INTRAVENOUS | Status: AC
  Filled 2018-12-27: qty 40

## 2018-12-27 MED ORDER — SEVOFLURANE IN SOLN
RESPIRATORY_TRACT | Status: AC
  Filled 2018-12-27: qty 250

## 2018-12-27 MED ORDER — HYDROMORPHONE HCL 1 MG/ML IJ SOLN
0.2500 mg | INTRAMUSCULAR | Status: DC | PRN
  Administered 2018-12-27: 0.5 mg via INTRAVENOUS
  Filled 2018-12-27: qty 0.5

## 2018-12-27 MED ORDER — KETAMINE HCL 10 MG/ML IJ SOLN
INTRAMUSCULAR | Status: DC | PRN
  Administered 2018-12-27: 10 mg via INTRAVENOUS

## 2018-12-27 MED ORDER — LIDOCAINE HCL (CARDIAC) PF 100 MG/5ML IV SOSY
PREFILLED_SYRINGE | INTRAVENOUS | Status: DC | PRN
  Administered 2018-12-27: 40 mg via INTRAVENOUS

## 2018-12-27 MED ORDER — MIRTAZAPINE 15 MG PO TBDP
15.0000 mg | ORAL_TABLET | Freq: Every day | ORAL | Status: DC
  Administered 2018-12-27 – 2018-12-31 (×5): 15 mg via ORAL
  Filled 2018-12-27 (×8): qty 1

## 2018-12-27 MED ORDER — HYDROCODONE-ACETAMINOPHEN 7.5-325 MG PO TABS: 1.0000 | ORAL_TABLET | Freq: Once | ORAL | Status: DC | PRN

## 2018-12-27 MED ORDER — AMLODIPINE BESYLATE 5 MG PO TABS
10.0000 mg | ORAL_TABLET | Freq: Every day | ORAL | Status: DC
  Administered 2018-12-28 – 2019-01-01 (×5): 10 mg via ORAL
  Filled 2018-12-27 (×5): qty 2

## 2018-12-27 MED ORDER — PROMETHAZINE HCL 25 MG/ML IJ SOLN: 6.2500 mg | INTRAMUSCULAR | Status: DC | PRN

## 2018-12-27 MED ORDER — GLYCOPYRROLATE 0.2 MG/ML IJ SOLN
INTRAMUSCULAR | Status: DC | PRN
  Administered 2018-12-27: 0.2 mg via INTRAVENOUS

## 2018-12-27 MED ORDER — TAMSULOSIN HCL 0.4 MG PO CAPS
0.4000 mg | ORAL_CAPSULE | Freq: Every day | ORAL | Status: DC
  Administered 2018-12-27 – 2018-12-31 (×4): 0.4 mg via ORAL
  Filled 2018-12-27 (×4): qty 1

## 2018-12-27 MED ORDER — LACTATED RINGERS IV SOLN
INTRAVENOUS | Status: DC
  Administered 2018-12-27: 1000 mL via INTRAVENOUS

## 2018-12-27 MED ORDER — MIDAZOLAM HCL 2 MG/2ML IJ SOLN: 0.5000 mg | Freq: Once | INTRAMUSCULAR | Status: DC | PRN

## 2018-12-27 MED ORDER — SERTRALINE HCL 50 MG PO TABS
100.0000 mg | ORAL_TABLET | Freq: Every day | ORAL | Status: DC
  Administered 2018-12-28 – 2019-01-01 (×5): 100 mg via ORAL
  Filled 2018-12-27 (×5): qty 2

## 2018-12-27 NOTE — Progress Notes (Signed)
Subjective: Resting comfortably with no complaints.  Objective: Vital signs in last 24 hours: Temp:  [98.1 F (36.7 C)-99.3 F (37.4 C)] 99.3 F (37.4 C) (02/19 0447) Pulse Rate:  [62-70] 62 (02/19 0447) Resp:  [16-20] 20 (02/19 0447) BP: (123-154)/(36-92) 131/58 (02/19 0447) SpO2:  [94 %-96 %] 95 % (02/19 0447) Weight:  [78.7 kg] 78.7 kg (02/18 1413) Last BM Date: 12/26/18  Intake/Output from previous day: 02/18 0701 - 02/19 0700 In: 779.2 [I.V.:678.6; IV Piggyback:100.6] Out: -  Intake/Output this shift: No intake/output data recorded.  General appearance: alert, cooperative and no distress GI: soft, non-tender; bowel sounds normal; no masses,  no organomegaly  Lab Results:  Recent Labs    12/26/18 0539 12/27/18 0420  WBC 16.1* 18.6*  HGB 10.6* 9.8*  HCT 32.6* 30.2*  PLT 266 316   BMET Recent Labs    12/26/18 0539 12/27/18 0420  NA 137 138  K 3.3* 3.2*  CL 106 106  CO2 21* 23  GLUCOSE 148* 126*  BUN 52* 39*  CREATININE 0.99 1.02  CALCIUM 8.5* 8.2*   PT/INR Recent Labs    12/25/18 1621  LABPROT 15.1  INR 1.20    Studies/Results: Ct Abdomen Pelvis W Contrast  Result Date: 12/25/2018 CLINICAL DATA:  Abdominal pain with fever EXAM: CT ABDOMEN AND PELVIS WITH CONTRAST TECHNIQUE: Multidetector CT imaging of the abdomen and pelvis was performed using the standard protocol following bolus administration of intravenous contrast. CONTRAST:  OMNIPAQUE IOHEXOL 300 MG/ML  SOLN COMPARISON:  None. FINDINGS: Lower chest: Small right-sided pleural effusion. Partial atelectasis in the right lower lobe. The heart size is within normal limits. Small hiatal hernia. Hepatobiliary: Distended gallbladder with multiple calcified stones. Mild soft tissue stranding in the right upper quadrant adjacent to the gallbladder. Perihepatic fluid. Probable cyst at the porta hepatis. No definite biliary enlargement. Pancreas: Unremarkable. No pancreatic ductal dilatation or  surrounding inflammatory changes. Spleen: Normal in size without focal abnormality. Adrenals/Urinary Tract: Adrenal glands are normal. Parapelvic cysts. No hydronephrosis. Air within the bladder. Possible small gas containing diverticulum off the anterior wall of the bladder. Stomach/Bowel: Stomach is nonenlarged. No dilated small bowel. No colon wall thickening. Negative appendix. Sigmoid colon diverticula. Vascular/Lymphatic: Moderate aortic atherosclerosis. No aneurysm. No significantly enlarged lymph nodes. Reproductive: Enlarged prostate. Fluid-filled defect within the prostate which may be postsurgical. Other: No free air. Trace free fluid in the pelvis. Small fat containing supraumbilical ventral hernia. Musculoskeletal: Degenerative changes. No acute or suspicious abnormality. IMPRESSION: 1. Dilated gallbladder with multiple calcified stones and suspected pericholecystic fluid and mild inflammatory changes raising concern for an acute cholecystitis. Suggest correlation with ultrasound. There is perihepatic fluid, which may be secondary to inflammatory process from the gallbladder. 2. Small right pleural effusion and partial atelectasis at the right base. 3. Bilateral parapelvic renal cysts 4. Small fat containing supraumbilical ventral hernia Electronically Signed   By: Jasmine Pang M.D.   On: 12/25/2018 22:49   US Abdomen Limited Ruq  Result Date: 12/26/2018 CLINICAL DATA:  Right upper quadrant pain.  Abnormal LFTs. EXAM: ULTRASOUND ABDOMEN LIMITED RIGHT UPPER QUADRANT COMPARISON:  CT abdomen and pelvis 12/25/2018 FINDINGS: Gallbladder: Numerous small stones in the gallbladder without a dominant, discretely measurable stone. Mild gallbladder wall thickening measuring 4 mm. Pericholecystic fluid. No sonographic Murphy sign noted by sonographer. Common bile duct: Diameter: 4 mm Liver: Mildly increased parenchymal echogenicity diffusely with mildly nodular liver contour. No focal lesion identified. Small  volume perihepatic free fluid. Portal vein is patent on color  Doppler imaging with normal direction of blood flow towards the liver. IMPRESSION: 1. Numerous small gallstones with mild gallbladder wall thickening. This may reflect acute cholecystitis in the appropriate clinical setting (particularly given the gallbladder distension and regional inflammatory changes on CT) although there was no sonographic Murphy sign. Nuclear medicine HIDA scan could be considered to evaluate cystic duct patency of clinically indicated. 2. Mildly echogenic liver, nonspecific though may reflect steatosis or chronic hepatitis with early cirrhosis possible given mildly nodular liver contour. Chronic liver disease could also contribute to gallbladder wall thickening. 3. No biliary dilatation. Electronically Signed   By: Sebastian Ache M.D.   On: 12/26/2018 09:27    Anti-infectives: Anti-infectives (From admission, onward)   Start     Dose/Rate Route Frequency Ordered Stop   12/26/18 1800  piperacillin-tazobactam (ZOSYN) IVPB 3.375 g     3.375 g 12.5 mL/hr over 240 Minutes Intravenous Every 8 hours 12/26/18 1727     12/26/18 0600  piperacillin-tazobactam (ZOSYN) IVPB 3.375 g     3.375 g 12.5 mL/hr over 240 Minutes Intravenous  Once 12/26/18 0011 12/26/18 1255   12/25/18 2100  piperacillin-tazobactam (ZOSYN) IVPB 3.375 g     3.375 g 100 mL/hr over 30 Minutes Intravenous  Once 12/25/18 2047 12/25/18 2312      Assessment/Plan: Impression: Cholelithiasis with elevated liver enzyme tests, evidence for cirrhosis, blood per rectum Plan: For EGD today by Dr. Darrick Penna.  Leukocytosis only mildly improved.  Liver enzyme test still elevated.  Wife is now reconsidering laparoscopic cholecystectomy.  Will reevaluate condition in a.m.  No need for acute surgical invention at this time.  LOS: 2 days    Franky Macho 12/27/2018

## 2018-12-27 NOTE — Interval H&P Note (Signed)
History and Physical Interval Note:  12/27/2018 2:10 PM  Dymere Glunt  has presented today for surgery, with the diagnosis of MELENA  The various methods of treatment have been discussed with the patient and family. After consideration of risks, benefits and other options for treatment, the patient has consented to  Procedure(s): ESOPHAGOGASTRODUODENOSCOPY (EGD) WITH PROPOFOL (N/A) as a surgical intervention .  The patient's history has been reviewed, patient examined, no change in status, stable for surgery.  I have reviewed the patient's chart and labs.  Questions were answered to the patient's satisfaction.     Abisai Deer   Hemoglobin 9.8 this morning  Seen in short stay. Hemodynamically stable.  Irregular heart rate suggested on monitor but twelve-lead EKG shows normal sinus rhythm.  Discussed with anesthesia.  No history of dysphagia.  Good to go for diagnostic EGD.  The risks, benefits, limitations, alternatives and imponderables have been reviewed with the patient. Potential for esophageal dilation, biopsy, etc. have also been reviewed.  Questions have been answered. All parties agreeable.  Further recommendations to follow.

## 2018-12-27 NOTE — Op Note (Signed)
[facility] Patient Name: Richard Eaton Procedure Date: 12/27/2018 2:14 PM MRN: [id number] Date of Birth: 1941/01/20 Attending MD: Gennette Pac , MD CSN: [id number] Age: 78 Admit Type: Inpatient Procedure:                Upper GI endoscopy Indications:              Melena Providers:                Gennette Pac, MD, Criselda Peaches. Patsy Lager, RN,                            Edythe Clarity, Technician Referring MD:              Medicines:                Propofol per Anesthesia Complications:            No immediate complications. Estimated Blood Loss:     Estimated blood loss was minimal. Procedure:                Pre-Anesthesia Assessment:                           - Prior to the procedure, a History and Physical                            was performed, and patient medications and                            allergies were reviewed. The patient's tolerance of                            previous anesthesia was also reviewed. The risks                            and benefits of the procedure and the sedation                            options and risks were discussed with the patient.                            All questions were answered, and informed consent                            was obtained. Prior Anticoagulants: The patient has                            taken no previous anticoagulant or antiplatelet                            agents. ASA Grade Assessment: II - A patient with                            mild systemic disease. After reviewing the risks  and benefits, the patient was deemed in                            satisfactory condition to undergo the procedure.                           After obtaining informed consent, the endoscope was                            passed under direct vision. Throughout the                            procedure, the patient's blood pressure, pulse, and                            oxygen saturations were  monitored continuously. The                            [id number]-H190 ([id number] scope was introduced through the                            and advanced to the second part of duodenum. The                            upper GI endoscopy was accomplished without                            difficulty. The patient tolerated the procedure                            well. Scope In: 2:23:30 PM Scope Out: 2:30:49 PM Total Procedure Duration: 0 hours 7 minutes 19 seconds  Findings:      Abnormal esophageal mucosa. Salmon-colored epithelium coming up in the       GE junction (7 cm) to 20 cm (just below the UES) consistent with       Barrett's esophagus. No leading edge esophagitis. Varices seen. Tubular       esophagus was patent throughout its course.      A medium-sized hiatal hernia was present. Diffuse gastric erosions.       Sheria LangCameron lesions present. Patchy erythema throughout the gastric mucosa.       No ulcer or infiltrating process seen. No portal gastropathy. Patent       pylorus.      The duodenal bulb and second portion of the duodenum were normal.       Biopsies of the esophageal body at 30 cm as well as gastric mucosa       biopsies taken. Impression:               -Long segment Barrett's esophagus?"status post                            biopsy.                           - Medium-sized hiatal hernia. Gastric  erosions/Cameron lesions. Stomach could have easily                            bled recently. Status post biopsy.                           - Normal duodenal bulb and second portion of the                            duodenum. No stigmata of chronic liver disease seen                            on today's EGD. Moderate Sedation:      Moderate (conscious) sedation was personally administered by an       anesthesia professional. The following parameters were monitored: oxygen       saturation, heart rate, blood pressure, respiratory rate, EKG, adequacy        of pulmonary ventilation, and response to care. Recommendation:           - Return patient to hospital ward for ongoing care.                            Clinically the patient has acute cholecystitis.                            Would benefit from surgery. Hhowever, the risks /                            benefits need to be considered. Continue PPI. Clear                            liquid diet. Do not recommend a colonoscopy at this                            time. Discussed findings and recommendations at                            length with the patient's wife room 308. Procedure Code(s):        --- Professional ---                           [id number], Esophagogastroduodenoscopy, flexible,                            transoral; diagnostic, including collection of                            specimen(s) by brushing or washing, when performed                            (separate procedure) Diagnosis Code(s):        --- Professional ---                           K22.8, Other specified diseases  of esophagus                           K44.9, Diaphragmatic hernia without obstruction or                            gangrene                           K92.1, Melena (includes Hematochezia) CPT copyright 2018 American [employer]. All rights reserved. The codes documented in this report are preliminary and upon coder review may  be revised to meet current compliance requirements. Gerrit Friends. Surie Suchocki, MD Gennette Pac, MD 12/27/2018 3:04:35 PM This report has been signed electronically. Number of Addenda: 0

## 2018-12-27 NOTE — Anesthesia Procedure Notes (Signed)
Procedure Name: MAC Performed by: Adams, Amy A, CRNA Pre-anesthesia Checklist: Patient identified, Emergency Drugs available, Suction available, Timeout performed and Patient being monitored Patient Re-evaluated:Patient Re-evaluated prior to induction Oxygen Delivery Method: Non-rebreather mask       

## 2018-12-27 NOTE — Anesthesia Postprocedure Evaluation (Signed)
Anesthesia Post Note  Patient: Richard Eaton  Procedure(s) Performed: ESOPHAGOGASTRODUODENOSCOPY (EGD) WITH PROPOFOL (N/A ) BIOPSY  Patient location during evaluation: PACU Anesthesia Type: General Level of consciousness: awake and alert and oriented Pain management: pain level controlled Vital Signs Assessment: post-procedure vital signs reviewed and stable Respiratory status: spontaneous breathing Cardiovascular status: stable Postop Assessment: no apparent nausea or vomiting Anesthetic complications: no     Last Vitals:  Vitals:   12/27/18 1334 12/27/18 1440  BP: (!) 154/68 (!) 148/85  Pulse: 60 67  Resp: 20 11  Temp: 37 C (P) 36.8 C  SpO2: 90% 96%    Last Pain:  Vitals:   12/27/18 1419  TempSrc:   PainSc: 0-No pain                 ADAMS, AMY A

## 2018-12-27 NOTE — Transfer of Care (Signed)
Immediate Anesthesia Transfer of Care Note  Patient: Richard Eaton  Procedure(s) Performed: ESOPHAGOGASTRODUODENOSCOPY (EGD) WITH PROPOFOL (N/A ) BIOPSY  Patient Location: PACU  Anesthesia Type:General  Level of Consciousness: awake, oriented and patient cooperative  Airway & Oxygen Therapy: Patient Spontanous Breathing  Post-op Assessment: Report given to RN and Post -op Vital signs reviewed and stable  Post vital signs: Reviewed and stable  Last Vitals:  Vitals Value Taken Time  BP 148/85 12/27/2018  2:40 PM  Temp    Pulse 68 12/27/2018  2:45 PM  Resp 17 12/27/2018  2:45 PM  SpO2 97 % 12/27/2018  2:45 PM  Vitals shown include unvalidated device data.  Last Pain:  Vitals:   12/27/18 1419  TempSrc:   PainSc: 0-No pain      Patients Stated Pain Goal: 6 (12/27/18 1334)  Complications: No apparent anesthesia complications

## 2018-12-27 NOTE — Plan of Care (Signed)

## 2018-12-27 NOTE — Anesthesia Preprocedure Evaluation (Signed)
Anesthesia Evaluation  Patient identified by MRN, date of birth, ID band Patient awake    Reviewed: Allergy & Precautions, NPO status , Patient's Chart, lab work & pertinent test results  Airway Mallampati: II  TM Distance: >3 FB Neck ROM: Full    Dental no notable dental hx. (+) Teeth Intact   Pulmonary neg pulmonary ROS,    Pulmonary exam normal breath sounds clear to auscultation       Cardiovascular Exercise Tolerance: Poor hypertension, +CHF  Normal cardiovascular examII Rhythm:Regular Rate:Normal  D/w wife had w/u ~ one year ago for ? CHF ECG done today sinus  EF from old Echo appears normal    Neuro/Psych PSYCHIATRIC DISORDERS Dementia  Neuromuscular disease    GI/Hepatic negative GI ROS, Neg liver ROS, H/o Melena and GB type issues  Family wants non operative intervention  Will proceed with UGI today under GA D/w wife -Full code at present    Endo/Other  negative endocrine ROSdiabetesDiet controlled   Renal/GU negative Renal ROS  negative genitourinary   Musculoskeletal negative musculoskeletal ROS (+)   Abdominal   Peds negative pediatric ROS (+)  Hematology negative hematology ROS (+)   Anesthesia Other Findings   Reproductive/Obstetrics negative OB ROS                             Anesthesia Physical Anesthesia Plan  ASA: III  Anesthesia Plan: General   Post-op Pain Management:    Induction: Intravenous  PONV Risk Score and Plan:   Airway Management Planned: Nasal Cannula and Simple Face Mask  Additional Equipment:   Intra-op Plan:   Post-operative Plan: Extubation in OR  Informed Consent: I have reviewed the patients History and Physical, chart, labs and discussed the procedure including the risks, benefits and alternatives for the proposed anesthesia with the patient or authorized representative who has indicated his/her understanding and acceptance.      Dental advisory given  Plan Discussed with: CRNA  Anesthesia Plan Comments: (D/w wife plan GA - hope not to use ETT But full code if becomes unstable - Pt with mild dementia -Wife Consented -WTP)        Anesthesia Quick Evaluation

## 2018-12-27 NOTE — Progress Notes (Signed)
PROGRESS NOTE    Richard [name]  ZOX:[id number] DOB: 02-Jun-1941 DOA: 12/25/2018 PCP: Kela MillinBarrino, Alethea Y, MD    Brief Narrative:  78 year old male with history of dementia, hypertension and diet-controlled diabetes, presented to the hospital with severe right upper quadrant pain, nausea and vomiting for approximately 5 days.  He also had dark tarry stools for 2 days prior to admission.  Work-up in the emergency room indicated possible acute cholecystitis.  He was started on intravenous antibiotics and seen by general surgery.  Family is considering surgical options.  He was also seen by GI for melena and underwent EGD with results as below.   Assessment & Plan:   Principal Problem:   Acute cholecystitis Active Problems:   Alzheimer disease (HCC)   Hypertension   Diet-controlled diabetes mellitus (HCC)   Melena   Hypokalemia   Pressure injury of skin   1. Acute cholecystitis.  Seen by general surgery.  Family is considering cholecystectomy.  Continue IV Zosyn. 2. Melena.  Patient was having dark tarry stools prior to admission.  BUN was elevated.  Hemoglobin has been stable.  EGD shows gastric erosions which may have been the etiology of bleeding.  Continue on PPI. 3. Diabetes.  Diet controlled at home.  A1c 7.4.  Continue to follow blood sugars with sliding scale insulin. 4. Dementia.  Continue on Namenda. 5. Hypertension.  Continue on losartan and amlodipine   DVT prophylaxis: SCDs Code Status: Full code Family Communication: Discussed with wife at the bedside Disposition Plan: Discharge home once improved   Consultants:   General surgery  Gastroenterology  Procedures:  EGD:-Long segment Barrett's esophagus?"status post                            biopsy.                           - Medium-sized hiatal hernia. Gastric                            erosions/Cameron lesions. Stomach could have easily                            bled recently. Status post biopsy.                - Normal duodenal bulb and second portion of the                            duodenum. No stigmata of chronic liver disease seen                             on today's EGD.  Antimicrobials:   Zosyn 2/17 >   Subjective: No abdominal pain.  No nausea or vomiting.  Objective: Vitals:   12/27/18 1334 12/27/18 1440 12/27/18 1445 12/27/18 1500  BP: (!) 154/68 (!) 148/85 (!) 153/77 (!) 154/73  Pulse: 60 67 68 68  Resp: 20 11 17 14   Temp: 98.6 F (37 C) 98.3 F (36.8 C)    TempSrc: Oral     SpO2: 90% 96% 97% 94%  Weight:      Height:        Intake/Output Summary (Last 24 hours) at 12/27/2018 1725 Last data filed at 12/27/2018 1444  Gross per 24 hour  Intake 350 ml  Output -  Net 350 ml   Filed Weights   12/25/18 1602 12/26/18 1413 12/27/18 0447  Weight: 80.7 kg 78.7 kg 78.3 kg    Examination:  General exam: Alert, awake, no distress Respiratory system: Clear to auscultation. Respiratory effort normal. Cardiovascular system:RRR. No murmurs, rubs, gallops. Gastrointestinal system: Abdomen is nondistended, soft and nontender. No organomegaly or masses felt. Normal bowel sounds heard. Central nervous system: No focal neurological deficits. Extremities: No C/C/E, +pedal pulses Skin: No rashes, lesions or ulcers Psychiatry: Judgement and insight appear normal. Mood & affect appropriate.    Data Reviewed: I have personally reviewed following labs and imaging studies  CBC: Recent Labs  Lab 12/25/18 1621 12/26/18 0539 12/27/18 0420  WBC 18.8* 16.1* 18.6*  NEUTROABS  --  13.0*  --   HGB 11.9* 10.6* 9.8*  HCT 35.8* 32.6* 30.2*  MCV 93.5 93.9 95.6  PLT 349 266 316   Basic Metabolic Panel: Recent Labs  Lab 12/25/18 1621 12/26/18 0539 12/27/18 0420  NA 137 137 138  K 3.3* 3.3* 3.2*  CL 101 106 106  CO2 24 21* 23  GLUCOSE 208* 148* 126*  BUN 54* 52* 39*  CREATININE 1.08 0.99 1.02  CALCIUM 9.2 8.5* 8.2*  MG  --  2.2  --    GFR: Estimated  Creatinine Clearance: 65.6 mL/min (by C-G formula based on SCr of 1.02 mg/dL). Liver Function Tests: Recent Labs  Lab 12/25/18 1621 12/26/18 0539 12/27/18 0420  AST 112* 52* 49*  ALT 101* 73* 64*  ALKPHOS 88 72 97  BILITOT 1.2 0.6 0.6  PROT 7.5 6.2* 6.2*  ALBUMIN 3.1* 2.6* 2.5*   Recent Labs  Lab 12/25/18 1621 12/26/18 0539  LIPASE 15 14   No results for input(s): AMMONIA in the last 168 hours. Coagulation Profile: Recent Labs  Lab 12/25/18 1621  INR 1.20   Cardiac Enzymes: No results for input(s): CKTOTAL, CKMB, CKMBINDEX, TROPONINI in the last 168 hours. BNP (last 3 results) No results for input(s): PROBNP in the last 8760 hours. HbA1C: No results for input(s): HGBA1C in the last 72 hours. CBG: Recent Labs  Lab 12/27/18 0442 12/27/18 0809 12/27/18 1054 12/27/18 1404 12/27/18 1644  GLUCAP 129* 145* 145* 149* 135*   Lipid Profile: No results for input(s): CHOL, HDL, LDLCALC, TRIG, CHOLHDL, LDLDIRECT in the last 72 hours. Thyroid Function Tests: No results for input(s): TSH, T4TOTAL, FREET4, T3FREE, THYROIDAB in the last 72 hours. Anemia Panel: No results for input(s): VITAMINB12, FOLATE, FERRITIN, TIBC, IRON, RETICCTPCT in the last 72 hours. Sepsis Labs: No results for input(s): PROCALCITON, LATICACIDVEN in the last 168 hours.  No results found for this or any previous visit (from the past 240 hour(s)).       Radiology Studies: Ct Abdomen Pelvis W Contrast  Result Date: 12/25/2018 CLINICAL DATA:  Abdominal pain with fever EXAM: CT ABDOMEN AND PELVIS WITH CONTRAST TECHNIQUE: Multidetector CT imaging of the abdomen and pelvis was performed using the standard protocol following bolus administration of intravenous contrast. CONTRAST:  OMNIPAQUE IOHEXOL 300 MG/ML  SOLN COMPARISON:  None. FINDINGS: Lower chest: Small right-sided pleural effusion. Partial atelectasis in the right lower lobe. The heart size is within normal limits. Small hiatal hernia.  Hepatobiliary: Distended gallbladder with multiple calcified stones. Mild soft tissue stranding in the right upper quadrant adjacent to the gallbladder. Perihepatic fluid. Probable cyst at the porta hepatis. No definite biliary enlargement. Pancreas: Unremarkable. No pancreatic ductal dilatation or  surrounding inflammatory changes. Spleen: Normal in size without focal abnormality. Adrenals/Urinary Tract: Adrenal glands are normal. Parapelvic cysts. No hydronephrosis. Air within the bladder. Possible small gas containing diverticulum off the anterior wall of the bladder. Stomach/Bowel: Stomach is nonenlarged. No dilated small bowel. No colon wall thickening. Negative appendix. Sigmoid colon diverticula. Vascular/Lymphatic: Moderate aortic atherosclerosis. No aneurysm. No significantly enlarged lymph nodes. Reproductive: Enlarged prostate. Fluid-filled defect within the prostate which may be postsurgical. Other: No free air. Trace free fluid in the pelvis. Small fat containing supraumbilical ventral hernia. Musculoskeletal: Degenerative changes. No acute or suspicious abnormality. IMPRESSION: 1. Dilated gallbladder with multiple calcified stones and suspected pericholecystic fluid and mild inflammatory changes raising concern for an acute cholecystitis. Suggest correlation with ultrasound. There is perihepatic fluid, which may be secondary to inflammatory process from the gallbladder. 2. Small right pleural effusion and partial atelectasis at the right base. 3. Bilateral parapelvic renal cysts 4. Small fat containing supraumbilical ventral hernia Electronically Signed   By: Jasmine Pang M.D.   On: 12/25/2018 22:49   US Abdomen Limited Ruq  Result Date: 12/26/2018 CLINICAL DATA:  Right upper quadrant pain.  Abnormal LFTs. EXAM: ULTRASOUND ABDOMEN LIMITED RIGHT UPPER QUADRANT COMPARISON:  CT abdomen and pelvis 12/25/2018 FINDINGS: Gallbladder: Numerous small stones in the gallbladder without a dominant, discretely  measurable stone. Mild gallbladder wall thickening measuring 4 mm. Pericholecystic fluid. No sonographic Murphy sign noted by sonographer. Common bile duct: Diameter: 4 mm Liver: Mildly increased parenchymal echogenicity diffusely with mildly nodular liver contour. No focal lesion identified. Small volume perihepatic free fluid. Portal vein is patent on color Doppler imaging with normal direction of blood flow towards the liver. IMPRESSION: 1. Numerous small gallstones with mild gallbladder wall thickening. This may reflect acute cholecystitis in the appropriate clinical setting (particularly given the gallbladder distension and regional inflammatory changes on CT) although there was no sonographic Murphy sign. Nuclear medicine HIDA scan could be considered to evaluate cystic duct patency of clinically indicated. 2. Mildly echogenic liver, nonspecific though may reflect steatosis or chronic hepatitis with early cirrhosis possible given mildly nodular liver contour. Chronic liver disease could also contribute to gallbladder wall thickening. 3. No biliary dilatation. Electronically Signed   By: Sebastian Ache M.D.   On: 12/26/2018 09:27        Scheduled Meds: . feeding supplement  1 Container Oral TID BM  . insulin aspart  0-9 Units Subcutaneous Q4H  . pantoprazole (PROTONIX) IV  40 mg Intravenous Q12H  . sodium chloride flush  3 mL Intravenous Once   Continuous Infusions: . piperacillin-tazobactam (ZOSYN)  IV 3.375 g (12/27/18 1031)     LOS: 2 days    Time spent:    Erick Blinks, MD [facility]   If 7PM-7AM, please contact night-coverage [email or web address]  12/27/2018, 5:25 PM

## 2018-12-28 DIAGNOSIS — R1011 Right upper quadrant pain: Secondary | ICD-10-CM

## 2018-12-28 DIAGNOSIS — R7401 Elevation of levels of liver transaminase levels: Secondary | ICD-10-CM

## 2018-12-28 DIAGNOSIS — R740 Nonspecific elevation of levels of transaminase and lactic acid dehydrogenase [LDH]: Secondary | ICD-10-CM

## 2018-12-28 LAB — COMPREHENSIVE METABOLIC PANEL
ALT: 69 U/L — ABNORMAL HIGH (ref 0–44)
AST: 71 U/L — ABNORMAL HIGH (ref 15–41)
Albumin: 2.5 g/dL — ABNORMAL LOW (ref 3.5–5.0)
Alkaline Phosphatase: 95 U/L (ref 38–126)
Anion gap: 7 (ref 5–15)
BUN: 29 mg/dL — ABNORMAL HIGH (ref 8–23)
CHLORIDE: 107 mmol/L (ref 98–111)
CO2: 24 mmol/L (ref 22–32)
Calcium: 8 mg/dL — ABNORMAL LOW (ref 8.9–10.3)
Creatinine, Ser: 0.93 mg/dL (ref 0.61–1.24)
GFR calc Af Amer: >60 mL/min (ref >60)
GFR calc non Af Amer: >60 mL/min (ref >60)
GLUCOSE: 176 mg/dL — AB (ref 70–99)
Potassium: 3.4 mmol/L — ABNORMAL LOW (ref 3.5–5.1)
SODIUM: 138 mmol/L (ref 135–145)
Total Bilirubin: 0.6 mg/dL (ref 0.3–1.2)
Total Protein: 6.4 g/dL — ABNORMAL LOW (ref 6.5–8.1)

## 2018-12-28 LAB — GLUCOSE, CAPILLARY
GLUCOSE-CAPILLARY: 171 mg/dL — AB (ref 70–99)
Glucose-Capillary: 182 mg/dL — ABNORMAL HIGH (ref 70–99)
Glucose-Capillary: 145 mg/dL — ABNORMAL HIGH (ref 70–99)
Glucose-Capillary: 153 mg/dL — ABNORMAL HIGH (ref 70–99)
Glucose-Capillary: 137 mg/dL — ABNORMAL HIGH (ref 70–99)

## 2018-12-28 LAB — CBC
HCT: 31.9 % — ABNORMAL LOW (ref 39.0–52.0)
HEMOGLOBIN: 10.1 g/dL — AB (ref 13.0–17.0)
MCH: 30.2 pg (ref 26.0–34.0)
MCHC: 31.7 g/dL (ref 30.0–36.0)
MCV: 95.5 fL (ref 80.0–100.0)
Platelets: 354 10*3/uL (ref 150–400)
RBC: 3.34 MIL/uL — AB (ref 4.22–5.81)
RDW: 12.4 % (ref 11.5–15.5)
WBC: 16.1 10*3/uL — ABNORMAL HIGH (ref 4.0–10.5)
nRBC: 0 % (ref 0.0–0.2)

## 2018-12-28 LAB — PROTIME-INR
INR: 1.35
Prothrombin Time: 16.5 seconds — ABNORMAL HIGH (ref 11.4–15.2)

## 2018-12-28 NOTE — Addendum Note (Signed)
Addendum  created 12/28/18 1155 by Franco Nones, CRNA   Intraprocedure Staff edited

## 2018-12-28 NOTE — Progress Notes (Signed)
1 Day Post-Op  Subjective: Denies any abdominal pain.  Is hungry.  Objective: Vital signs in last 24 hours: Temp:  [98.1 F (36.7 C)-99.5 F (37.5 C)] 99.5 F (37.5 C) (02/20 0424) Pulse Rate:  [56-68] 58 (02/20 0424) Resp:  [11-20] 18 (02/20 0424) BP: (139-154)/(50-85) 142/62 (02/20 0424) SpO2:  [90 %-98 %] 98 % (02/20 0424) Weight:  [81.8 kg] 81.8 kg (02/20 0424) Last BM Date: 12/26/18  Intake/Output from previous day: 02/19 0701 - 02/20 0700 In: 300 [I.V.:300] Out: 800 [Urine:800] Intake/Output this shift: No intake/output data recorded.  General appearance: alert, cooperative and no distress GI: soft, non-tender; bowel sounds normal; no masses,  no organomegaly  Lab Results:  Recent Labs    12/27/18 0420 12/28/18 0420  WBC 18.6* 16.1*  HGB 9.8* 10.1*  HCT 30.2* 31.9*  PLT 316 354   BMET Recent Labs    12/27/18 0420 12/28/18 0420  NA 138 138  K 3.2* 3.4*  CL 106 107  CO2 23 24  GLUCOSE 126* 176*  BUN 39* 29*  CREATININE 1.02 0.93  CALCIUM 8.2* 8.0*   PT/INR Recent Labs    12/25/18 1621 12/28/18 0420  LABPROT 15.1 16.5*  INR 1.20 1.35    Studies/Results: US Abdomen Limited Ruq  Result Date: 12/26/2018 CLINICAL DATA:  Right upper quadrant pain.  Abnormal LFTs. EXAM: ULTRASOUND ABDOMEN LIMITED RIGHT UPPER QUADRANT COMPARISON:  CT abdomen and pelvis 12/25/2018 FINDINGS: Gallbladder: Numerous small stones in the gallbladder without a dominant, discretely measurable stone. Mild gallbladder wall thickening measuring 4 mm. Pericholecystic fluid. No sonographic Murphy sign noted by sonographer. Common bile duct: Diameter: 4 mm Liver: Mildly increased parenchymal echogenicity diffusely with mildly nodular liver contour. No focal lesion identified. Small volume perihepatic free fluid. Portal vein is patent on color Doppler imaging with normal direction of blood flow towards the liver. IMPRESSION: 1. Numerous small gallstones with mild gallbladder wall  thickening. This may reflect acute cholecystitis in the appropriate clinical setting (particularly given the gallbladder distension and regional inflammatory changes on CT) although there was no sonographic Murphy sign. Nuclear medicine HIDA scan could be considered to evaluate cystic duct patency of clinically indicated. 2. Mildly echogenic liver, nonspecific though may reflect steatosis or chronic hepatitis with early cirrhosis possible given mildly nodular liver contour. Chronic liver disease could also contribute to gallbladder wall thickening. 3. No biliary dilatation. Electronically Signed   By: Sebastian Ache M.D.   On: 12/26/2018 09:27    Anti-infectives: Anti-infectives (From admission, onward)   Start     Dose/Rate Route Frequency Ordered Stop   12/26/18 1800  piperacillin-tazobactam (ZOSYN) IVPB 3.375 g     3.375 g 12.5 mL/hr over 240 Minutes Intravenous Every 8 hours 12/26/18 1727     12/26/18 0600  piperacillin-tazobactam (ZOSYN) IVPB 3.375 g     3.375 g 12.5 mL/hr over 240 Minutes Intravenous  Once 12/26/18 0011 12/26/18 1255   12/25/18 2100  piperacillin-tazobactam (ZOSYN) IVPB 3.375 g     3.375 g 100 mL/hr over 30 Minutes Intravenous  Once 12/25/18 2047 12/25/18 2312      Assessment/Plan: s/p Procedure(s): ESOPHAGOGASTRODUODENOSCOPY (EGD) WITH PROPOFOL BIOPSY Impression: EGD report reviewed.  Patient's liver enzyme tests have normalized, but he still has a mild leukocytosis.  I do believe he has had an episode of cholecystitis.  He is at increased risk for surgical intervention given his dementia and evidence of cirrhosis.  Discussed extensively with the wife.  Will proceed to try a heart healthy diet and  see how he does.  Should this fail, may need to proceed with cholecystectomy.  I doubt the patient will tolerate a cholecystostomy tube.  LOS: 3 days    Franky Macho 12/28/2018

## 2018-12-28 NOTE — Plan of Care (Signed)

## 2018-12-28 NOTE — Progress Notes (Signed)
PROGRESS NOTE    Richard Eaton  ZOX:[id number] DOB: 09-08-1941 DOA: 12/25/2018 PCP: Kela MillinBarrino, Alethea Y, MD    Brief Narrative:  78 year old male with history of dementia, hypertension and diet-controlled diabetes, presented to the hospital with severe right upper quadrant pain, nausea and vomiting for approximately 5 days.  He also had dark tarry stools for 2 days prior to admission.  Work-up in the emergency room indicated possible acute cholecystitis.  He was started on intravenous antibiotics and seen by general surgery.  Family is considering surgical options.  He was also seen by GI for melena and underwent EGD with results as below.  Assessment & Plan:   Principal Problem:   Acute cholecystitis Active Problems:   Alzheimer disease (HCC)   Hypertension   Diet-controlled diabetes mellitus (HCC)   Melena   Hypokalemia   Pressure injury of skin  1. Acute cholecystitis.  Seen by general surgery.  Dr. Lovell SheehanJenkins and family considering cholecystectomy if can't tolerate diet.  Continue IV Zosyn.  He has been started on diet this morning but no appetite.  2. Melena.  Patient was having dark tarry stools prior to admission.  BUN was elevated.  Hemoglobin has been stable.  EGD shows gastric erosions which may have been the etiology of bleeding.  Continue on PPI. 3. Liver cirrhosis - He will need outpatient follow up with GI.   4. Leukocytosis - secondary to cholecystitis - continue IV antibiotics, follow WBC.  5. Diabetes.  Diet controlled at home.  A1c 7.4.  Continue to follow blood sugars with sliding scale insulin. 6. Dementia.  Continue on Namenda.  Wife remains at bedside.  7. Hypertension.  Continue on losartan and amlodipine.    DVT prophylaxis: SCDs Code Status: Full code Family Communication: Discussed with wife at the bedside Disposition Plan: Discharge home once improved  Consultants:   General surgery  Gastroenterology  Procedures:  EGD:-Long segment Barrett's esophagus  status post biopsy.                           - Medium-sized hiatal hernia. Gastric erosions/Cameron lesions. Stomach could have easily                            bled recently. Status post biopsy.                           - Normal duodenal bulb and second portion of the duodenum. No stigmata of chronic liver disease seen                             on today's EGD.  Antimicrobials:   Zosyn 2/17 >  Subjective: Pt has no appetite and difficult to get him to eat or drink.  He had some abdominal pain overnight and was medicated.  No vomiting.  Objective: Vitals:   12/27/18 1500 12/27/18 2131 12/27/18 2133 12/28/18 0424  BP: (!) 154/73  (!) 143/50 (!) 142/62  Pulse: 68  (!) 58 (!) 58  Resp: 14  20 18   Temp:   99 F (37.2 C) 99.5 F (37.5 C)  TempSrc:   Axillary Oral  SpO2: 94% 95% 97% 98%  Weight:    81.8 kg  Height:        Intake/Output Summary (Last 24 hours) at 12/28/2018 0944 Last data filed at 12/28/2018  0500 Gross per 24 hour  Intake 300 ml  Output 800 ml  Net -500 ml   Filed Weights   12/26/18 1413 12/27/18 0447 12/28/18 0424  Weight: 78.7 kg 78.3 kg 81.8 kg    Examination:  General exam: Alert, awake, no distress Respiratory system: Clear to auscultation. Respiratory effort normal. Cardiovascular system:normal s1,s2 sounds. No murmurs, rubs, gallops. Gastrointestinal system: Abdomen is nondistended, soft and nontender. No organomegaly or masses felt. Normal bowel sounds heard. Central nervous system: No focal neurological deficits. Extremities: No C/C/E, +pedal pulses Skin: No rashes, lesions or ulcers Psychiatry: Judgement and insight appear normal. Mood & affect appropriate.   Data Reviewed: I have personally reviewed following labs and imaging studies  CBC: Recent Labs  Lab 12/25/18 1621 12/26/18 0539 12/27/18 0420 12/28/18 0420  WBC 18.8* 16.1* 18.6* 16.1*  NEUTROABS  --  13.0*  --   --   HGB 11.9* 10.6* 9.8* 10.1*  HCT 35.8* 32.6* 30.2* 31.9*    MCV 93.5 93.9 95.6 95.5  PLT 349 266 316 354   Basic Metabolic Panel: Recent Labs  Lab 12/25/18 1621 12/26/18 0539 12/27/18 0420 12/28/18 0420  NA 137 137 138 138  K 3.3* 3.3* 3.2* 3.4*  CL 101 106 106 107  CO2 24 21* 23 24  GLUCOSE 208* 148* 126* 176*  BUN 54* 52* 39* 29*  CREATININE 1.08 0.99 1.02 0.93  CALCIUM 9.2 8.5* 8.2* 8.0*  MG  --  2.2  --   --    GFR: Estimated Creatinine Clearance: 72 mL/min (by C-G formula based on SCr of 0.93 mg/dL). Liver Function Tests: Recent Labs  Lab 12/25/18 1621 12/26/18 0539 12/27/18 0420 12/28/18 0420  AST 112* 52* 49* 71*  ALT 101* 73* 64* 69*  ALKPHOS 88 72 97 95  BILITOT 1.2 0.6 0.6 0.6  PROT 7.5 6.2* 6.2* 6.4*  ALBUMIN 3.1* 2.6* 2.5* 2.5*   Recent Labs  Lab 12/25/18 1621 12/26/18 0539  LIPASE 15 14   No results for input(s): AMMONIA in the last 168 hours. Coagulation Profile: Recent Labs  Lab 12/25/18 1621 12/28/18 0420  INR 1.20 1.35   Cardiac Enzymes: No results for input(s): CKTOTAL, CKMB, CKMBINDEX, TROPONINI in the last 168 hours. BNP (last 3 results) No results for input(s): PROBNP in the last 8760 hours. HbA1C: No results for input(s): HGBA1C in the last 72 hours. CBG: Recent Labs  Lab 12/27/18 1644 12/27/18 2121 12/27/18 2357 12/28/18 0423 12/28/18 0733  GLUCAP 135* 139* 223* 182* 145*   Lipid Profile: No results for input(s): CHOL, HDL, LDLCALC, TRIG, CHOLHDL, LDLDIRECT in the last 72 hours. Thyroid Function Tests: No results for input(s): TSH, T4TOTAL, FREET4, T3FREE, THYROIDAB in the last 72 hours. Anemia Panel: No results for input(s): VITAMINB12, FOLATE, FERRITIN, TIBC, IRON, RETICCTPCT in the last 72 hours. Sepsis Labs: No results for input(s): PROCALCITON, LATICACIDVEN in the last 168 hours.  No results found for this or any previous visit (from the past 240 hour(s)).   Radiology Studies: No results found.  Scheduled Meds: . amLODipine  10 mg Oral Daily  . feeding supplement   1 Container Oral TID BM  . insulin aspart  0-9 Units Subcutaneous Q4H  . losartan  100 mg Oral QHS  . memantine  10 mg Oral BID  . mirtazapine  15 mg Oral QHS  . pantoprazole (PROTONIX) IV  40 mg Intravenous Q12H  . sertraline  100 mg Oral Daily  . sodium chloride flush  3 mL Intravenous Once  .  tamsulosin  0.4 mg Oral QPC supper   Continuous Infusions: . piperacillin-tazobactam (ZOSYN)  IV 3.375 g (12/28/18 0908)    LOS: 3 days   Time spent: 30 mins  Bilan Tedesco Laural Benes, MD [facility]  If 7PM-7AM, please contact night-coverage [email or web address]  12/28/2018, 9:44 AM

## 2018-12-29 ENCOUNTER — Inpatient Hospital Stay (HOSPITAL_COMMUNITY): Payer: Medicare Other

## 2018-12-29 ENCOUNTER — Encounter: Payer: Self-pay | Admitting: Internal Medicine

## 2018-12-29 ENCOUNTER — Encounter (HOSPITAL_COMMUNITY): Payer: Self-pay | Admitting: Internal Medicine

## 2018-12-29 LAB — CBC WITH DIFFERENTIAL/PLATELET
Abs Immature Granulocytes: 0.28 10*3/uL — ABNORMAL HIGH (ref 0.00–0.07)
Basophils Absolute: 0.1 10*3/uL (ref 0.0–0.1)
Basophils Relative: 0 %
Eosinophils Absolute: 0.3 10*3/uL (ref 0.0–0.5)
Eosinophils Relative: 2 %
HCT: 32.1 % — ABNORMAL LOW (ref 39.0–52.0)
Hemoglobin: 10.1 g/dL — ABNORMAL LOW (ref 13.0–17.0)
Immature Granulocytes: 2 %
Lymphocytes Relative: 9 %
Lymphs Abs: 1.4 10*3/uL (ref 0.7–4.0)
MCH: 29.5 pg (ref 26.0–34.0)
MCHC: 31.5 g/dL (ref 30.0–36.0)
MCV: 93.9 fL (ref 80.0–100.0)
MONOS PCT: 6 %
Monocytes Absolute: 1 10*3/uL (ref 0.1–1.0)
NEUTROS PCT: 81 %
Neutro Abs: 13 10*3/uL — ABNORMAL HIGH (ref 1.7–7.7)
Platelets: 354 10*3/uL (ref 150–400)
RBC: 3.42 MIL/uL — ABNORMAL LOW (ref 4.22–5.81)
RDW: 12.5 % (ref 11.5–15.5)
WBC: 16 10*3/uL — ABNORMAL HIGH (ref 4.0–10.5)
nRBC: 0 % (ref 0.0–0.2)

## 2018-12-29 LAB — GLUCOSE, CAPILLARY
GLUCOSE-CAPILLARY: 133 mg/dL — AB (ref 70–99)
Glucose-Capillary: 125 mg/dL — ABNORMAL HIGH (ref 70–99)
Glucose-Capillary: 143 mg/dL — ABNORMAL HIGH (ref 70–99)
Glucose-Capillary: 148 mg/dL — ABNORMAL HIGH (ref 70–99)
Glucose-Capillary: 166 mg/dL — ABNORMAL HIGH (ref 70–99)
Glucose-Capillary: 167 mg/dL — ABNORMAL HIGH (ref 70–99)
Glucose-Capillary: 177 mg/dL — ABNORMAL HIGH (ref 70–99)

## 2018-12-29 LAB — COMPREHENSIVE METABOLIC PANEL
ALT: 74 U/L — ABNORMAL HIGH (ref 0–44)
AST: 63 U/L — AB (ref 15–41)
Albumin: 2.2 g/dL — ABNORMAL LOW (ref 3.5–5.0)
Alkaline Phosphatase: 119 U/L (ref 38–126)
Anion gap: 7 (ref 5–15)
BUN: 21 mg/dL (ref 8–23)
CHLORIDE: 106 mmol/L (ref 98–111)
CO2: 23 mmol/L (ref 22–32)
Calcium: 7.7 mg/dL — ABNORMAL LOW (ref 8.9–10.3)
Creatinine, Ser: 0.9 mg/dL (ref 0.61–1.24)
GFR calc Af Amer: >60 mL/min (ref >60)
Glucose, Bld: 133 mg/dL — ABNORMAL HIGH (ref 70–99)
Potassium: 3.3 mmol/L — ABNORMAL LOW (ref 3.5–5.1)
Sodium: 136 mmol/L (ref 135–145)
Total Bilirubin: 0.8 mg/dL (ref 0.3–1.2)
Total Protein: 5.9 g/dL — ABNORMAL LOW (ref 6.5–8.1)

## 2018-12-29 LAB — MAGNESIUM: Magnesium: 2.2 mg/dL (ref 1.7–2.4)

## 2018-12-29 MED ORDER — POTASSIUM CHLORIDE CRYS ER 20 MEQ PO TBCR
60.0000 meq | EXTENDED_RELEASE_TABLET | Freq: Once | ORAL | Status: AC
  Administered 2018-12-29: 60 meq via ORAL
  Filled 2018-12-29: qty 3

## 2018-12-29 MED ORDER — SENNOSIDES-DOCUSATE SODIUM 8.6-50 MG PO TABS
2.0000 | ORAL_TABLET | Freq: Two times a day (BID) | ORAL | Status: DC
  Administered 2018-12-29 (×2): 2 via ORAL
  Filled 2018-12-29 (×3): qty 2

## 2018-12-29 NOTE — Progress Notes (Signed)
2 Days Post-Op  Subjective: Patient has intermittent nonspecific pain.  He is eating.  Objective: Vital signs in last 24 hours: Temp:  [99.3 F (37.4 C)-99.9 F (37.7 C)] 99.3 F (37.4 C) (02/20 2149) Pulse Rate:  [58-60] 60 (02/20 2149) Resp:  [18] 18 (02/20 2149) BP: (130-143)/(67-70) 143/67 (02/20 2149) SpO2:  [92 %-94 %] 92 % (02/20 2059) Weight:  [81.7 kg] 81.7 kg (02/20 2149) Last BM Date: 12/24/18  Intake/Output from previous day: 02/20 0701 - 02/21 0700 In: 90 [P.O.:90] Out: 350 [Urine:350] Intake/Output this shift: Total I/O In: 60 [P.O.:60] Out: -   General appearance: alert, cooperative and no distress GI: soft, non-tender; bowel sounds normal; no masses,  no organomegaly  Lab Results:  Recent Labs    12/28/18 0420 12/29/18 0521  WBC 16.1* 16.0*  HGB 10.1* 10.1*  HCT 31.9* 32.1*  PLT 354 354   BMET Recent Labs    12/28/18 0420 12/29/18 0521  NA 138 136  K 3.4* 3.3*  CL 107 106  CO2 24 23  GLUCOSE 176* 133*  BUN 29* 21  CREATININE 0.93 0.90  CALCIUM 8.0* 7.7*   PT/INR Recent Labs    12/28/18 0420  LABPROT 16.5*  INR 1.35    Studies/Results: Dg Chest Port 1 View  Result Date: 12/29/2018 CLINICAL DATA:  Leukocytosis. EXAM: PORTABLE CHEST 1 VIEW COMPARISON:  05/06/2018. FINDINGS: Patient is rotated to the left. Mediastinum and hilar structures normal. Cardiomegaly. Mild right base atelectasis/infiltrate. Tiny left pleural effusion versus pleural scarring. No pneumothorax. IMPRESSION: 1.  Mild right base atelectasis/infiltrate. 2. Cardiomegaly. Tiny left pleural effusion versus pleural scarring. Electronically Signed   By: Maisie Fus  Register   On: 12/29/2018 08:23    Anti-infectives: Anti-infectives (From admission, onward)   Start     Dose/Rate Route Frequency Ordered Stop   12/26/18 1800  piperacillin-tazobactam (ZOSYN) IVPB 3.375 g     3.375 g 12.5 mL/hr over 240 Minutes Intravenous Every 8 hours 12/26/18 1727     12/26/18 0600   piperacillin-tazobactam (ZOSYN) IVPB 3.375 g     3.375 g 12.5 mL/hr over 240 Minutes Intravenous  Once 12/26/18 0011 12/26/18 1255   12/25/18 2100  piperacillin-tazobactam (ZOSYN) IVPB 3.375 g     3.375 g 100 mL/hr over 30 Minutes Intravenous  Once 12/25/18 2047 12/25/18 2312      Assessment/Plan: s/p Procedure(s): ESOPHAGOGASTRODUODENOSCOPY (EGD) WITH PROPOFOL BIOPSY Impression: Patient still with persistent leukocytosis.  Chest x-ray today reveals possible right-sided infiltrate.  I doubt the leukocytosis is related to his gallbladder at this time.  I have extensive discussion with the patient's wife.  We are going to delay any cholecystectomy until he has recovered from this hospitalization.  She understands and agrees.  Okay for discharge from surgery standpoint.  LOS: 4 days    Franky Macho 12/29/2018

## 2018-12-29 NOTE — Care Management Important Message (Signed)
Important Message  Patient Details  Name: Richard Eaton MRN: [id number] Date of Birth: March 06, 1941   Medicare Important Message Given:  Yes    Richard Eaton 12/29/2018, 12:00 PM

## 2018-12-29 NOTE — Progress Notes (Signed)
PROGRESS NOTE    Richard Eaton  [id number] DOB: 1940/11/17 DOA: 12/25/2018 PCP: Kela MillinBarrino, Alethea Y, MD    Brief Narrative:  78 year old male with history of dementia, hypertension and diet-controlled diabetes, presented to the hospital with severe right upper quadrant pain, nausea and vomiting for approximately 5 days.  He also had dark tarry stools for 2 days prior to admission.  Work-up in the emergency room indicated possible acute cholecystitis.  He was started on intravenous antibiotics and seen by general surgery.  Family is considering surgical options.  He was also seen by GI for melena and underwent EGD with results as below.  Assessment & Plan:   Principal Problem:   Acute cholecystitis Active Problems:   Alzheimer disease (HCC)   Hypertension   Diet-controlled diabetes mellitus (HCC)   Melena   Hypokalemia   Pressure injury of skin   Elevated transaminase level   RUQ pain  1. Acute cholecystitis.  Seen by general surgery.  Dr. Lovell SheehanJenkins and family considering cholecystectomy next week if no meaningful improvement with supportive measures.  Continue IV Zosyn.  He has been started on diet this morning but no appetite.  2. Melena.  Patient was having dark tarry stools prior to admission.  BUN was elevated.  Hemoglobin has been stable.  EGD shows gastric erosions which may have been the etiology of bleeding.  Continue on PPI. 3. Liver cirrhosis - He will need outpatient follow up with GI.   4. Leukocytosis - WBC unchanged.  Likely secondary to cholecystitis - continue IV antibiotics, follow WBC.  5. Diabetes.  Diet controlled at home.  A1c 7.4.  Continue to follow blood sugars with sliding scale insulin. 6. Dementia.  Continue on Namenda.  Wife remains at bedside.  7. Hypertension.  Continue on losartan and amlodipine.   8. Constipation - stool softeners ordered.    DVT prophylaxis: SCDs Code Status: Full code Family Communication: Discussed with wife at the  bedside Disposition Plan: Discharge home once improved  Consultants:   General surgery  Gastroenterology  Procedures:  EGD:-Long segment Barrett's esophagus status post biopsy.                           - Medium-sized hiatal hernia. Gastric erosions/Cameron lesions. Stomach could have easily                            bled recently. Status post biopsy.                           - Normal duodenal bulb and second portion of the duodenum. No stigmata of chronic liver disease seen                             on today's EGD.  Antimicrobials:   Zosyn 2/17 >  Subjective: Pt ate minimally yesterday just a few bites and sips at a time, still had 3 doses of pain meds yesterday.  Objective: Vitals:   12/28/18 0424 12/28/18 1316 12/28/18 2059 12/28/18 2149  BP: (!) 142/62 130/70  (!) 143/67  Pulse: (!) 58 (!) 58  60  Resp: 18 18  18   Temp: 99.5 F (37.5 C) 99.9 F (37.7 C)  99.3 F (37.4 C)  TempSrc: Oral Oral  Oral  SpO2: 98% 94% 92%   Weight: 81.8 kg  81.7 kg  Height:        Intake/Output Summary (Last 24 hours) at 12/29/2018 0932 Last data filed at 12/29/2018 0931 Gross per 24 hour  Intake 150 ml  Output 350 ml  Net -200 ml   Filed Weights   12/27/18 0447 12/28/18 0424 12/28/18 2149  Weight: 78.3 kg 81.8 kg 81.7 kg    Examination:  General exam: Alert, awake, no distress Respiratory system: Clear to auscultation. Respiratory effort normal. Cardiovascular system:normal s1,s2 sounds. No murmurs, rubs, gallops. Gastrointestinal system: Abdomen is nondistended, soft and mild RUQ tenderness with palpation and some guarding. No organomegaly or masses felt. Normal bowel sounds heard. Central nervous system: No focal neurological deficits. Extremities: No C/C/E, +pedal pulses Skin: No rashes, lesions or ulcers Psychiatry: Judgement and insight appear normal. Mood & affect appropriate.   Data Reviewed: I have personally reviewed following labs and imaging  studies  CBC: Recent Labs  Lab 12/25/18 1621 12/26/18 0539 12/27/18 0420 12/28/18 0420 12/29/18 0521  WBC 18.8* 16.1* 18.6* 16.1* 16.0*  NEUTROABS  --  13.0*  --   --  13.0*  HGB 11.9* 10.6* 9.8* 10.1* 10.1*  HCT 35.8* 32.6* 30.2* 31.9* 32.1*  MCV 93.5 93.9 95.6 95.5 93.9  PLT 349 266 316 354 354   Basic Metabolic Panel: Recent Labs  Lab 12/25/18 1621 12/26/18 0539 12/27/18 0420 12/28/18 0420 12/29/18 0521  NA 137 137 138 138 136  K 3.3* 3.3* 3.2* 3.4* 3.3*  CL 101 106 106 107 106  CO2 24 21* 23 24 23   GLUCOSE 208* 148* 126* 176* 133*  BUN 54* 52* 39* 29* 21  CREATININE 1.08 0.99 1.02 0.93 0.90  CALCIUM 9.2 8.5* 8.2* 8.0* 7.7*  MG  --  2.2  --   --  2.2   GFR: Estimated Creatinine Clearance: 74.4 mL/min (by C-G formula based on SCr of 0.9 mg/dL). Liver Function Tests: Recent Labs  Lab 12/25/18 1621 12/26/18 0539 12/27/18 0420 12/28/18 0420 12/29/18 0521  AST 112* 52* 49* 71* 63*  ALT 101* 73* 64* 69* 74*  ALKPHOS 88 72 97 95 119  BILITOT 1.2 0.6 0.6 0.6 0.8  PROT 7.5 6.2* 6.2* 6.4* 5.9*  ALBUMIN 3.1* 2.6* 2.5* 2.5* 2.2*   Recent Labs  Lab 12/25/18 1621 12/26/18 0539  LIPASE 15 14   No results for input(s): AMMONIA in the last 168 hours. Coagulation Profile: Recent Labs  Lab 12/25/18 1621 12/28/18 0420  INR 1.20 1.35   Cardiac Enzymes: No results for input(s): CKTOTAL, CKMB, CKMBINDEX, TROPONINI in the last 168 hours. BNP (last 3 results) No results for input(s): PROBNP in the last 8760 hours. HbA1C: No results for input(s): HGBA1C in the last 72 hours. CBG: Recent Labs  Lab 12/28/18 1731 12/28/18 2012 12/29/18 0040 12/29/18 0408 12/29/18 0737  GLUCAP 171* 137* 143* 133* 125*   Lipid Profile: No results for input(s): CHOL, HDL, LDLCALC, TRIG, CHOLHDL, LDLDIRECT in the last 72 hours. Thyroid Function Tests: No results for input(s): TSH, T4TOTAL, FREET4, T3FREE, THYROIDAB in the last 72 hours. Anemia Panel: No results for input(s):  VITAMINB12, FOLATE, FERRITIN, TIBC, IRON, RETICCTPCT in the last 72 hours. Sepsis Labs: No results for input(s): PROCALCITON, LATICACIDVEN in the last 168 hours.  No results found for this or any previous visit (from the past 240 hour(s)).   Radiology Studies: Dg Chest Port 1 View  Result Date: 12/29/2018 CLINICAL DATA:  Leukocytosis. EXAM: PORTABLE CHEST 1 VIEW COMPARISON:  05/06/2018. FINDINGS: Patient is rotated to the left.  Mediastinum and hilar structures normal. Cardiomegaly. Mild right base atelectasis/infiltrate. Tiny left pleural effusion versus pleural scarring. No pneumothorax. IMPRESSION: 1.  Mild right base atelectasis/infiltrate. 2. Cardiomegaly. Tiny left pleural effusion versus pleural scarring. Electronically Signed   By: Maisie Fus  Register   On: 12/29/2018 08:23    Scheduled Meds: . amLODipine  10 mg Oral Daily  . feeding supplement  1 Container Oral TID BM  . insulin aspart  0-9 Units Subcutaneous Q4H  . losartan  100 mg Oral QHS  . memantine  10 mg Oral BID  . mirtazapine  15 mg Oral QHS  . pantoprazole (PROTONIX) IV  40 mg Intravenous Q12H  . senna-docusate  2 tablet Oral BID  . sertraline  100 mg Oral Daily  . sodium chloride flush  3 mL Intravenous Once  . tamsulosin  0.4 mg Oral QPC supper   Continuous Infusions: . piperacillin-tazobactam (ZOSYN)  IV 3.375 g (12/29/18 0102)    LOS: 4 days   Time spent:27 mins  Standley Dakins, MD [facility]  If 7PM-7AM, please contact night-coverage [email or web address]  12/29/2018, 9:32 AM

## 2018-12-30 DIAGNOSIS — D72829 Elevated white blood cell count, unspecified: Secondary | ICD-10-CM

## 2018-12-30 LAB — COMPREHENSIVE METABOLIC PANEL
ALT: 70 U/L — ABNORMAL HIGH (ref 0–44)
ANION GAP: 8 (ref 5–15)
AST: 52 U/L — ABNORMAL HIGH (ref 15–41)
Albumin: 2.5 g/dL — ABNORMAL LOW (ref 3.5–5.0)
Alkaline Phosphatase: 154 U/L — ABNORMAL HIGH (ref 38–126)
BUN: 17 mg/dL (ref 8–23)
CO2: 22 mmol/L (ref 22–32)
Calcium: 8.1 mg/dL — ABNORMAL LOW (ref 8.9–10.3)
Chloride: 107 mmol/L (ref 98–111)
Creatinine, Ser: 0.82 mg/dL (ref 0.61–1.24)
GFR calc Af Amer: >60 mL/min (ref >60)
GFR calc non Af Amer: >60 mL/min (ref >60)
Glucose, Bld: 146 mg/dL — ABNORMAL HIGH (ref 70–99)
POTASSIUM: 3.6 mmol/L (ref 3.5–5.1)
SODIUM: 137 mmol/L (ref 135–145)
Total Bilirubin: 0.8 mg/dL (ref 0.3–1.2)
Total Protein: 6.7 g/dL (ref 6.5–8.1)

## 2018-12-30 LAB — GLUCOSE, CAPILLARY
Glucose-Capillary: 141 mg/dL — ABNORMAL HIGH (ref 70–99)
Glucose-Capillary: 165 mg/dL — ABNORMAL HIGH (ref 70–99)
Glucose-Capillary: 144 mg/dL — ABNORMAL HIGH (ref 70–99)
Glucose-Capillary: 151 mg/dL — ABNORMAL HIGH (ref 70–99)
Glucose-Capillary: 125 mg/dL — ABNORMAL HIGH (ref 70–99)

## 2018-12-30 LAB — CBC WITH DIFFERENTIAL/PLATELET
Abs Immature Granulocytes: 0.24 10*3/uL — ABNORMAL HIGH (ref 0.00–0.07)
Basophils Absolute: 0.1 10*3/uL (ref 0.0–0.1)
Basophils Relative: 0 %
EOS ABS: 0.2 10*3/uL (ref 0.0–0.5)
Eosinophils Relative: 1 %
HCT: 33.7 % — ABNORMAL LOW (ref 39.0–52.0)
Hemoglobin: 10.9 g/dL — ABNORMAL LOW (ref 13.0–17.0)
Immature Granulocytes: 1 %
Lymphocytes Relative: 12 %
Lymphs Abs: 2.3 10*3/uL (ref 0.7–4.0)
MCH: 30.4 pg (ref 26.0–34.0)
MCHC: 32.3 g/dL (ref 30.0–36.0)
MCV: 93.9 fL (ref 80.0–100.0)
Monocytes Absolute: 1.1 10*3/uL — ABNORMAL HIGH (ref 0.1–1.0)
Monocytes Relative: 5 %
Neutro Abs: 15.6 10*3/uL — ABNORMAL HIGH (ref 1.7–7.7)
Neutrophils Relative %: 81 %
Platelets: 430 10*3/uL — ABNORMAL HIGH (ref 150–400)
RBC: 3.59 MIL/uL — AB (ref 4.22–5.81)
RDW: 12.1 % (ref 11.5–15.5)
WBC: 19.5 10*3/uL — ABNORMAL HIGH (ref 4.0–10.5)
nRBC: 0 % (ref 0.0–0.2)

## 2018-12-30 LAB — INFLUENZA PANEL BY PCR (TYPE A & B)
Influenza A By PCR: NEGATIVE
Influenza B By PCR: NEGATIVE

## 2018-12-30 MED ORDER — SENNOSIDES-DOCUSATE SODIUM 8.6-50 MG PO TABS: 2.0000 | ORAL_TABLET | Freq: Every evening | ORAL | Status: DC | PRN

## 2018-12-30 MED ORDER — SACCHAROMYCES BOULARDII 250 MG PO CAPS
250.0000 mg | ORAL_CAPSULE | Freq: Two times a day (BID) | ORAL | Status: DC
  Administered 2018-12-30 – 2019-01-01 (×5): 250 mg via ORAL
  Filled 2018-12-30 (×5): qty 1

## 2018-12-30 MED ORDER — SODIUM CHLORIDE 0.9 % IV SOLN
INTRAVENOUS | Status: DC
  Administered 2018-12-30 – 2018-12-31 (×2): via INTRAVENOUS

## 2018-12-30 MED ORDER — AMOXICILLIN-POT CLAVULANATE 875-125 MG PO TABS
1.0000 | ORAL_TABLET | Freq: Two times a day (BID) | ORAL | Status: DC
  Administered 2018-12-30 – 2019-01-01 (×5): 1 via ORAL
  Filled 2018-12-30 (×5): qty 1

## 2018-12-30 MED ORDER — POTASSIUM CHLORIDE IN NACL 20-0.9 MEQ/L-% IV SOLN: INTRAVENOUS | Status: DC

## 2018-12-30 NOTE — Progress Notes (Signed)
PROGRESS NOTE    Richard Eaton  IOX:[id number] DOB: 11-13-1940 DOA: 12/25/2018 PCP: Kela Millin, MD   Brief Narrative:  78 year old male with history of dementia, hypertension and diet-controlled diabetes, presented to the hospital with severe right upper quadrant pain, nausea and vomiting for approximately 5 days.  He also had dark tarry stools for 2 days prior to admission.  Work-up in the emergency room indicated possible acute cholecystitis.  He was started on intravenous antibiotics and seen by general surgery.  Family is considering surgical options.  He was also seen by GI for melena and underwent EGD with results as below.  Assessment & Plan:   Principal Problem:   Acute cholecystitis Active Problems:   Alzheimer disease (HCC)   Hypertension   Diet-controlled diabetes mellitus (HCC)   Melena   Hypokalemia   Pressure injury of skin   Elevated transaminase level   RUQ pain  1. Acute cholecystitis.  Seen by general surgery.   Pt is HIGH RISK for surgical intervention and he is being managed conservatively for now.  He is tolerating diet but appetite remains poor.  Consider palliative medicine consult for goals of care and comfort if he is still here on Monday.  His prognosis is poor if his eating and drinking does not improve.  I discussed with wife at length and she seems to remain in denial.   2. Melena.  Resolved now.  Patient had dark tarry stools prior to admission.  BUN was elevated.  Hemoglobin being followed.  EGD showed gastric erosions which is likely etiology of bleeding.  Continue on PPI medical therapy. 3. Pneumonia - completing course of oral augmentin.   4. Liver cirrhosis - He will need outpatient follow up with GI.   5. Leukocytosis - WBC slightly up today.  Clinically he is nontoxic. Check Flu test.  Treat pneumonia.  If loose stool persists would consider checking c diff panel.   6. Diabetes.  Diet controlled at home.  A1c 7.4.  His BS stable as he is not  eating well.  Reduce CBG testing for comfort.  7. Dementia.  He is starting to sundown.  Continue on Namenda.  Wife remains at bedside.  8. Hypertension.  Continue on losartan and amlodipine.   9. Constipation - Resolved, Discontinued scheduled stool softeners.     DVT prophylaxis: SCDs Code Status: Full code Family Communication: Discussed with wife at the bedside Disposition Plan: Discharge home   Consultants:   General surgery  Gastroenterology  Procedures:  EGD:-Long segment Barrett's esophagus status post biopsy.                           - Medium-sized hiatal hernia. Gastric erosions/Cameron lesions. Stomach could have easily                            bled recently. Status post biopsy.                           - Normal duodenal bulb and second portion of the duodenum. No stigmata of chronic liver disease seen on today's EGD.  Antimicrobials:   Zosyn 2/17 >2/22  augmentin 2/22 >  Subjective: Pt eating and having waxing/waning confusion.  Had large loose stool this morning.   Objective: Vitals:   12/30/18 0359 12/30/18 0500 12/30/18 0828 12/30/18 1300  BP: (!) 144/72  (!) 137/58  132/62  Pulse: 62  67 60  Resp: Temp: 98.6 F (37 C)  97.9 F (36.6 C) 98.1 F (36.7 C)  TempSrc: Oral  Oral Oral  SpO2: 95%  98% 100%  Weight:  81.8 kg    Height:        Intake/Output Summary (Last 24 hours) at 12/30/2018 1531 Last data filed at 12/30/2018 1100 Gross per 24 hour  Intake 180 ml  Output 1200 ml  Net -1020 ml   Filed Weights   12/28/18 0424 12/28/18 2149 12/30/18 0500  Weight: 81.8 kg 81.7 kg 81.8 kg   Examination:  General exam: Alert, awake, no distress Respiratory system: Clear to auscultation. Respiratory effort normal. Cardiovascular system: normal s1,s2 sounds. No murmurs, rubs, gallops. Gastrointestinal system: Abdomen is nondistended, soft and mild RUQ tenderness with palpation and some guarding. No organomegaly or masses felt. Normal bowel  sounds heard. Central nervous system: No focal neurological deficits. Extremities: No C/C/E, +pedal pulses Skin: No rashes, lesions or ulcers Psychiatry: Judgement and insight appear normal. Mood & affect appropriate.   Data Reviewed: I have personally reviewed following labs and imaging studies  CBC: Recent Labs  Lab 12/26/18 0539 12/27/18 0420 12/28/18 0420 12/29/18 0521 12/30/18 0614  WBC 16.1* 18.6* 16.1* 16.0* 19.5*  NEUTROABS 13.0*  --   --  13.0* 15.6*  HGB 10.6* 9.8* 10.1* 10.1* 10.9*  HCT 32.6* 30.2* 31.9* 32.1* 33.7*  MCV 93.9 95.6 95.5 93.9 93.9  PLT 266 316 354 354 430*   Basic Metabolic Panel: Recent Labs  Lab 12/26/18 0539 12/27/18 0420 12/28/18 0420 12/29/18 0521 12/30/18 0614  NA 137 138 138 136 137  K 3.3* 3.2* 3.4* 3.3* 3.6  CL 106 106 107 106 107  CO2 21* GLUCOSE 148* 126* 176* 133* 146*  BUN 52* 39* 29* 21 17  CREATININE 0.99 1.02 0.93 0.90 0.82  CALCIUM 8.5* 8.2* 8.0* 7.7* 8.1*  MG 2.2  --   --  2.2  --    GFR: Estimated Creatinine Clearance: 81.6 mL/min (by C-G formula based on SCr of 0.82 mg/dL). Liver Function Tests: Recent Labs  Lab 12/26/18 0539 12/27/18 0420 12/28/18 0420 12/29/18 0521 12/30/18 0614  AST 52* 49* 71* 63* 52*  ALT 73* 64* 69* 74* 70*  ALKPHOS 72 97 95 119 154*  BILITOT 0.6 0.6 0.6 0.8 0.8  PROT 6.2* 6.2* 6.4* 5.9* 6.7  ALBUMIN 2.6* 2.5* 2.5* 2.2* 2.5*   Recent Labs  Lab 12/25/18 1621 12/26/18 0539  LIPASE 15 14   No results for input(s): AMMONIA in the last 168 hours. Coagulation Profile: Recent Labs  Lab 12/25/18 1621 12/28/18 0420  INR 1.20 1.35   Cardiac Enzymes: No results for input(s): CKTOTAL, CKMB, CKMBINDEX, TROPONINI in the last 168 hours. BNP (last 3 results) No results for input(s): PROBNP in the last 8760 hours. HbA1C: No results for input(s): HGBA1C in the last 72 hours. CBG: Recent Labs  Lab 12/29/18 2002 12/29/18 2346 12/30/18 0353 12/30/18 0817 12/30/18 1130    GLUCAP 166* 167* 141* 165* 144*   Lipid Profile: No results for input(s): CHOL, HDL, LDLCALC, TRIG, CHOLHDL, LDLDIRECT in the last 72 hours. Thyroid Function Tests: No results for input(s): TSH, T4TOTAL, FREET4, T3FREE, THYROIDAB in the last 72 hours. Anemia Panel: No results for input(s): VITAMINB12, FOLATE, FERRITIN, TIBC, IRON, RETICCTPCT in the last 72 hours. Sepsis Labs: No results for input(s): PROCALCITON, LATICACIDVEN in the last 168 hours.  No results  found for this or any previous visit (from the past 240 hour(s)).   Radiology Studies: Dg Chest Port 1 View  Result Date: 12/29/2018 CLINICAL DATA:  Leukocytosis. EXAM: PORTABLE CHEST 1 VIEW COMPARISON:  05/06/2018. FINDINGS: Patient is rotated to the left. Mediastinum and hilar structures normal. Cardiomegaly. Mild right base atelectasis/infiltrate. Tiny left pleural effusion versus pleural scarring. No pneumothorax. IMPRESSION: 1.  Mild right base atelectasis/infiltrate. 2. Cardiomegaly. Tiny left pleural effusion versus pleural scarring. Electronically Signed   By: Maisie Fus  Register   On: 12/29/2018 08:23   Scheduled Meds: . amLODipine  10 mg Oral Daily  . amoxicillin-clavulanate  1 tablet Oral Q12H  . feeding supplement  1 Container Oral TID BM  . losartan  100 mg Oral QHS  . memantine  10 mg Oral BID  . mirtazapine  15 mg Oral QHS  . pantoprazole (PROTONIX) IV  40 mg Intravenous Q12H  . saccharomyces boulardii  250 mg Oral BID  . sertraline  100 mg Oral Daily  . sodium chloride flush  3 mL Intravenous Once  . tamsulosin  0.4 mg Oral QPC supper   Continuous Infusions: . sodium chloride      LOS: 5 days   Time spent: 24 mins  Standley Dakins, MD [facility]  If 7PM-7AM, please contact night-coverage [email or web address]  12/30/2018, 3:31 PM

## 2018-12-30 NOTE — Progress Notes (Signed)
3 Days Post-Op  Subjective: Patient denies any abdominal pain this morning.  Wife states that he was up during the night complaining of heartburn and belching.  Did have a bowel movement yesterday.  Objective: Vital signs in last 24 hours: Temp:  [97.9 F (36.6 C)-98.7 F (37.1 C)] 97.9 F (36.6 C) (02/22 0828) Pulse Rate:  [62-67] 67 (02/22 0828) Resp:  [17-18] 18 (02/22 0828) BP: (137-149)/(58-77) 137/58 (02/22 0828) SpO2:  [92 %-98 %] 98 % (02/22 0828) Weight:  [81.8 kg] 81.8 kg (02/22 0500) Last BM Date: 12/30/18  Intake/Output from previous day: 02/21 0701 - 02/22 0700 In: 360 [P.O.:360] Out: 1500 [Urine:1500] Intake/Output this shift: No intake/output data recorded.  General appearance: alert, cooperative and no distress GI: soft, non-tender; bowel sounds normal; no masses,  no organomegaly  Lab Results:  Recent Labs    12/29/18 0521 12/30/18 0614  WBC 16.0* 19.5*  HGB 10.1* 10.9*  HCT 32.1* 33.7*  PLT 354 430*   BMET Recent Labs    12/29/18 0521 12/30/18 0614  NA 136 137  K 3.3* 3.6  CL 106 107  CO2 23 22  GLUCOSE 133* 146*  BUN 21 17  CREATININE 0.90 0.82  CALCIUM 7.7* 8.1*   PT/INR Recent Labs    12/28/18 0420  LABPROT 16.5*  INR 1.35    Studies/Results: Dg Chest Port 1 View  Result Date: 12/29/2018 CLINICAL DATA:  Leukocytosis. EXAM: PORTABLE CHEST 1 VIEW COMPARISON:  05/06/2018. FINDINGS: Patient is rotated to the left. Mediastinum and hilar structures normal. Cardiomegaly. Mild right base atelectasis/infiltrate. Tiny left pleural effusion versus pleural scarring. No pneumothorax. IMPRESSION: 1.  Mild right base atelectasis/infiltrate. 2. Cardiomegaly. Tiny left pleural effusion versus pleural scarring. Electronically Signed   By: Maisie Fus  Register   On: 12/29/2018 08:23    Anti-infectives: Anti-infectives (From admission, onward)   Start     Dose/Rate Route Frequency Ordered Stop   12/30/18 1000  amoxicillin-clavulanate (AUGMENTIN)  875-125 MG per tablet 1 tablet     1 tablet Oral Every 12 hours 12/30/18 0756     12/26/18 1800  piperacillin-tazobactam (ZOSYN) IVPB 3.375 g  Status:  Discontinued     3.375 g 12.5 mL/hr over 240 Minutes Intravenous Every 8 hours 12/26/18 1727 12/30/18 0756   12/26/18 0600  piperacillin-tazobactam (ZOSYN) IVPB 3.375 g     3.375 g 12.5 mL/hr over 240 Minutes Intravenous  Once 12/26/18 0011 12/26/18 1255   12/25/18 2100  piperacillin-tazobactam (ZOSYN) IVPB 3.375 g     3.375 g 100 mL/hr over 30 Minutes Intravenous  Once 12/25/18 2047 12/25/18 2312      Assessment/Plan: s/p Procedure(s): ESOPHAGOGASTRODUODENOSCOPY (EGD) WITH PROPOFOL BIOPSY Impression: Patient does have an elevation in his white blood cell count.  This will be checked for influenza.  Patient does not clinically have evidence of biliary colic with right upper quadrant abdominal pain.  It is difficult to a certain that all his symptoms are secondary to his gallstones.  I did explain this extensively with the wife.  She understands that he is at increased risk for surgical intervention.  Would like to try to see if the patient improves as an outpatient.  Then we can need visit the issue cholecystectomy in the future.  She is agreeable to this.  LOS: 5 days    Franky Macho 12/30/2018

## 2018-12-31 LAB — COMPREHENSIVE METABOLIC PANEL
ALT: 46 U/L — ABNORMAL HIGH (ref 0–44)
AST: 26 U/L (ref 15–41)
Albumin: 2.1 g/dL — ABNORMAL LOW (ref 3.5–5.0)
Alkaline Phosphatase: 107 U/L (ref 38–126)
Anion gap: 7 (ref 5–15)
BUN: 16 mg/dL (ref 8–23)
CO2: 23 mmol/L (ref 22–32)
Calcium: 7.7 mg/dL — ABNORMAL LOW (ref 8.9–10.3)
Chloride: 107 mmol/L (ref 98–111)
Creatinine, Ser: 0.81 mg/dL (ref 0.61–1.24)
GFR calc Af Amer: >60 mL/min (ref >60)
Glucose, Bld: 212 mg/dL — ABNORMAL HIGH (ref 70–99)
Potassium: 3.1 mmol/L — ABNORMAL LOW (ref 3.5–5.1)
Sodium: 137 mmol/L (ref 135–145)
Total Bilirubin: 0.3 mg/dL (ref 0.3–1.2)
Total Protein: 5.9 g/dL — ABNORMAL LOW (ref 6.5–8.1)

## 2018-12-31 LAB — CBC WITH DIFFERENTIAL/PLATELET
Abs Immature Granulocytes: 0.12 10*3/uL — ABNORMAL HIGH (ref 0.00–0.07)
Basophils Absolute: 0 10*3/uL (ref 0.0–0.1)
Basophils Relative: 0 %
Eosinophils Absolute: 0.3 10*3/uL (ref 0.0–0.5)
Eosinophils Relative: 2 %
HCT: 29.4 % — ABNORMAL LOW (ref 39.0–52.0)
Hemoglobin: 9.3 g/dL — ABNORMAL LOW (ref 13.0–17.0)
IMMATURE GRANULOCYTES: 1 %
Lymphocytes Relative: 10 %
Lymphs Abs: 1.7 10*3/uL (ref 0.7–4.0)
MCH: 29.6 pg (ref 26.0–34.0)
MCHC: 31.6 g/dL (ref 30.0–36.0)
MCV: 93.6 fL (ref 80.0–100.0)
Monocytes Absolute: 0.9 10*3/uL (ref 0.1–1.0)
Monocytes Relative: 5 %
NEUTROS PCT: 82 %
Neutro Abs: 14.1 10*3/uL — ABNORMAL HIGH (ref 1.7–7.7)
Platelets: 386 10*3/uL (ref 150–400)
RBC: 3.14 MIL/uL — ABNORMAL LOW (ref 4.22–5.81)
RDW: 12.3 % (ref 11.5–15.5)
WBC: 17.1 10*3/uL — ABNORMAL HIGH (ref 4.0–10.5)
nRBC: 0 % (ref 0.0–0.2)

## 2018-12-31 LAB — GLUCOSE, CAPILLARY: GLUCOSE-CAPILLARY: 180 mg/dL — AB (ref 70–99)

## 2018-12-31 MED ORDER — MORPHINE SULFATE (PF) 2 MG/ML IV SOLN: 1.0000 mg | INTRAVENOUS | Status: DC | PRN

## 2018-12-31 MED ORDER — POTASSIUM CHLORIDE 10 MEQ/100ML IV SOLN
10.0000 meq | INTRAVENOUS | Status: AC
  Administered 2018-12-31 (×3): 10 meq via INTRAVENOUS
  Filled 2018-12-31 (×3): qty 100

## 2018-12-31 NOTE — Progress Notes (Signed)
4 Days Post-Op  Subjective: Patient's appetite has improved.  No abdominal pain noted.  Objective: Vital signs in last 24 hours: Temp:  [98.1 F (36.7 C)-98.6 F (37 C)] 98.3 F (36.8 C) (02/23 0601) Pulse Rate:  [55-65] 55 (02/23 0601) Resp:  [17-20] 17 (02/23 0601) BP: (132-168)/(59-66) 136/59 (02/23 0601) SpO2:  [94 %-100 %] 97 % (02/23 0601) Weight:  [81.7 kg] 81.7 kg (02/23 0500) Last BM Date: 12/29/18  Intake/Output from previous day: 02/22 0701 - 02/23 0700 In: 501.5 [I.V.:501.5] Out: 1500 [Urine:1500] Intake/Output this shift: Total I/O In: 240 [P.O.:240] Out: -   General appearance: alert, cooperative and no distress GI: soft, non-tender; bowel sounds normal; no masses,  no organomegaly  Lab Results:  Recent Labs    12/30/18 0614 12/31/18 0621  WBC 19.5* 17.1*  HGB 10.9* 9.3*  HCT 33.7* 29.4*  PLT 430* 386   BMET Recent Labs    12/30/18 0614 12/31/18 0621  NA 137 137  K 3.6 3.1*  CL 107 107  CO2 22 23  GLUCOSE 146* 212*  BUN 17 16  CREATININE 0.82 0.81  CALCIUM 8.1* 7.7*   PT/INR No results for input(s): LABPROT, INR in the last 72 hours.  Studies/Results: No results found.  Anti-infectives: Anti-infectives (From admission, onward)   Start     Dose/Rate Route Frequency Ordered Stop   12/30/18 1000  amoxicillin-clavulanate (AUGMENTIN) 875-125 MG per tablet 1 tablet     1 tablet Oral Every 12 hours 12/30/18 0756     12/26/18 1800  piperacillin-tazobactam (ZOSYN) IVPB 3.375 g  Status:  Discontinued     3.375 g 12.5 mL/hr over 240 Minutes Intravenous Every 8 hours 12/26/18 1727 12/30/18 0756   12/26/18 0600  piperacillin-tazobactam (ZOSYN) IVPB 3.375 g     3.375 g 12.5 mL/hr over 240 Minutes Intravenous  Once 12/26/18 0011 12/26/18 1255   12/25/18 2100  piperacillin-tazobactam (ZOSYN) IVPB 3.375 g     3.375 g 100 mL/hr over 30 Minutes Intravenous  Once 12/25/18 2047 12/25/18 2312      Assessment/Plan: s/p  Procedure(s): ESOPHAGOGASTRODUODENOSCOPY (EGD) WITH PROPOFOL BIOPSY Impression: No clinical evidence of biliary colic.  Patient's appetite is improving.  Wife/caregiver seems pleased.  Anticipate discharge tomorrow.  Will follow expectantly as an outpatient.  LOS: 6 days    Franky Macho 12/31/2018

## 2018-12-31 NOTE — Progress Notes (Signed)
PROGRESS NOTE    Richard Eaton  IRS:[id number] DOB: April 10, 1941 DOA: 12/25/2018 PCP: Kela Millin, MD   Brief Narrative:  78 year old male with history of dementia, hypertension and diet-controlled diabetes, presented to the hospital with severe right upper quadrant pain, nausea and vomiting for approximately 5 days.  He also had dark tarry stools for 2 days prior to admission.  Work-up in the emergency room indicated possible acute cholecystitis.  He was started on intravenous antibiotics and seen by general surgery.  Family is considering surgical options.  He was also seen by GI for melena and underwent EGD with results as below.  Assessment & Plan:   Principal Problem:   Acute cholecystitis Active Problems:   Alzheimer disease (HCC)   Hypertension   Diet-controlled diabetes mellitus (HCC)   Melena   Hypokalemia   Pressure injury of skin   Elevated transaminase level   RUQ pain   Leukocytosis  1. Acute cholecystitis.  Seen by general surgery.   Pt is HIGH RISK for surgical intervention and he is being managed conservatively for now.  He is tolerating diet but appetite remains poor.  Consider palliative medicine consult for goals of care and comfort if he is still here on Monday.  His prognosis is poor if his eating and drinking does not improve.  I discussed with wife at length.   2. Melena.  Resolved now.  Patient had dark tarry stools prior to admission.  BUN was elevated.  Hemoglobin being followed.  EGD showed gastric erosions which is likely etiology of bleeding.  Continue on PPI medical therapy. 3. Pneumonia - completing course of oral augmentin.   4. Liver cirrhosis - He will need outpatient follow up with GI.   5. Leukocytosis - WBC down today.  Clinically he is nontoxic. Flu test negative.  Treat pneumonia.   6. Type 2 diabetes mellitus.  Diet controlled at home.  A1c 7.4.  His BS stable as he is not eating well.  Reduce CBG testing for comfort.  7. Dementia.  He is  starting to sundown.  Continue on Namenda.  Wife remains at bedside.  8. Hypertension.  Continue on losartan and amlodipine.   9. Constipation - Resolved, Discontinued scheduled stool softeners.     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                           - Medium-sized hiatal hernia. Gastric erosions/Cameron lesions. Stomach could have easily                  bled recently. Status post biopsy.                           - Normal duodenal bulb and second portion of the duodenum. No stigmata of chronic liver disease seen on today's EGD.  Antimicrobials:   Zosyn 2/17 >2/22  augmentin 2/22 >  Subjective: Pt eating better per wife, he has no complaints.    Objective: Vitals:   12/30/18 2102 12/30/18 2107 12/31/18 0500 12/31/18 0601  BP: (!) 168/66   (!) 136/59  Pulse: 65   (!) 55  Resp: 18   17  Temp: 98.6 F (37 C)   98.3 F (36.8 C)  TempSrc: Oral   Oral  SpO2: 97% 94%  97%  Weight:   81.7 kg   Height:        Intake/Output Summary (Last 24 hours) at 12/31/2018 1223 Last data filed at 12/31/2018 0800 Gross per 24 hour  Intake 741.49 ml  Output 1100 ml  Net -358.51 ml   Filed Weights   12/28/18 2149 12/30/18 0500 12/31/18 0500  Weight: 81.7 kg 81.8 kg 81.7 kg   Examination:  General exam: Alert, awake, no distress Respiratory system: Clear to auscultation. Respiratory effort normal. Cardiovascular system: normal s1,s2 sounds. No murmurs, rubs, gallops. Gastrointestinal system: Abdomen is nondistended, soft and mild RUQ tenderness with palpation and some guarding. No organomegaly or masses felt. Normal bowel sounds heard. Central nervous system: No focal neurological deficits. Extremities: No C/C/E, +pedal pulses Skin: No rashes,  lesions or ulcers Psychiatry: Judgement and insight appear normal. Mood & affect appropriate.   Data Reviewed: I have personally reviewed following labs and imaging studies  CBC: Recent Labs  Lab 12/26/18 0539 12/27/18 0420 12/28/18 0420 12/29/18 0521 12/30/18 0614 12/31/18 0621  WBC 16.1* 18.6* 16.1* 16.0* 19.5* 17.1*  NEUTROABS 13.0*  --   --  13.0* 15.6* 14.1*  HGB 10.6* 9.8* 10.1* 10.1* 10.9* 9.3*  HCT 32.6* 30.2* 31.9* 32.1* 33.7* 29.4*  MCV 93.9 95.6 95.5 93.9 93.9 93.6  PLT 266 316 354 354 430* 386   Basic Metabolic Panel: Recent Labs  Lab 12/26/18 0539 12/27/18 0420 12/28/18 0420 12/29/18 0521 12/30/18 0614 12/31/18 0621  NA 137 138 138 136 137 137  K 3.3* 3.2* 3.4* 3.3* 3.6 3.1*  CL 106 106 107 106 107 107  CO2 21* 23 24 23 22 23   GLUCOSE 148* 126* 176* 133* 146* 212*  BUN 52* 39* 29* 21 17 16   CREATININE 0.99 1.02 0.93 0.90 0.82 0.81  CALCIUM 8.5* 8.2* 8.0* 7.7* 8.1* 7.7*  MG 2.2  --   --  2.2  --   --    GFR: Estimated Creatinine Clearance: 82.6 mL/min (by C-G formula based on SCr of 0.81 mg/dL). Liver Function Tests: Recent Labs  Lab 12/27/18 0420 12/28/18 0420 12/29/18 0521 12/30/18 0614 12/31/18 0621  AST 49* 71* 63* 52* 26  ALT 64* 69* 74* 70* 46*  ALKPHOS 97 95 119 154* 107  BILITOT 0.6 0.6 0.8 0.8 0.3  PROT 6.2* 6.4* 5.9* 6.7 5.9*  ALBUMIN 2.5* 2.5* 2.2* 2.5* 2.1*   Recent Labs  Lab 12/25/18 1621 12/26/18 0539  LIPASE 15 14   No results for input(s): AMMONIA in the last 168 hours. Coagulation Profile: Recent Labs  Lab 12/25/18 1621 12/28/18 0420  INR 1.20 1.35   Cardiac Enzymes: No results for input(s): CKTOTAL, CKMB, CKMBINDEX, TROPONINI in the last 168 hours. BNP (last 3 results) No results for input(s): PROBNP in the last 8760 hours. HbA1C: No results for input(s): HGBA1C in the last 72 hours. CBG: Recent Labs  Lab 12/30/18 0817 12/30/18 1130 12/30/18 1633 12/30/18 1954 12/31/18 0722  GLUCAP 165* 144* 151* 125*  180*   Lipid Profile: No results for input(s): CHOL, HDL, LDLCALC, TRIG, CHOLHDL, LDLDIRECT in the last 72 hours. Thyroid Function Tests: No results for input(s): TSH, T4TOTAL, FREET4, T3FREE, THYROIDAB in the last 72 hours. Anemia Panel: No results for input(s): VITAMINB12, FOLATE, FERRITIN, TIBC, IRON, RETICCTPCT in the last 72 hours. Sepsis Labs: No results for input(s): PROCALCITON, LATICACIDVEN in the last 168 hours.  No results found for this or any previous visit (from the  past 240 hour(s)).   Radiology Studies: No results found. Scheduled Meds: . amLODipine  10 mg Oral Daily  . amoxicillin-clavulanate  1 tablet Oral Q12H  . feeding supplement  1 Container Oral TID BM  . losartan  100 mg Oral QHS  . memantine  10 mg Oral BID  . mirtazapine  15 mg Oral QHS  . pantoprazole (PROTONIX) IV  40 mg Intravenous Q12H  . saccharomyces boulardii  250 mg Oral BID  . sertraline  100 mg Oral Daily  . sodium chloride flush  3 mL Intravenous Once  . tamsulosin  0.4 mg Oral QPC supper   Continuous Infusions: . sodium chloride 50 mL/hr at 12/30/18 1656  . potassium chloride      LOS: 6 days   Time spent: 22 mins  Standley Dakins, MD [facility]  If 7PM-7AM, please contact night-coverage [email or web address]  12/31/2018, 12:23 PM

## 2019-01-01 LAB — GLUCOSE, CAPILLARY: Glucose-Capillary: 137 mg/dL — ABNORMAL HIGH (ref 70–99)

## 2019-01-01 MED ORDER — SACCHAROMYCES BOULARDII 250 MG PO CAPS: 250.0000 mg | ORAL_CAPSULE | Freq: Two times a day (BID) | ORAL | 0 refills | Status: AC

## 2019-01-01 MED ORDER — COLLAGENASE 250 UNIT/GM EX OINT: TOPICAL_OINTMENT | Freq: Every day | CUTANEOUS | 0 refills | Status: AC

## 2019-01-01 MED ORDER — SERTRALINE HCL 50 MG PO TABS: 100.0000 mg | ORAL_TABLET | Freq: Every morning | ORAL | Status: DC

## 2019-01-01 MED ORDER — OXYCODONE HCL 5 MG PO TABS: 2.5000 mg | ORAL_TABLET | ORAL | 0 refills | Status: AC | PRN

## 2019-01-01 MED ORDER — COLLAGENASE 250 UNIT/GM EX OINT
TOPICAL_OINTMENT | Freq: Every day | CUTANEOUS | Status: DC
  Filled 2019-01-01: qty 30

## 2019-01-01 MED ORDER — ACETAMINOPHEN 325 MG PO TABS: 650.0000 mg | ORAL_TABLET | ORAL | Status: AC | PRN

## 2019-01-01 MED ORDER — AMOXICILLIN-POT CLAVULANATE 875-125 MG PO TABS: 1.0000 | ORAL_TABLET | Freq: Two times a day (BID) | ORAL | 0 refills | Status: AC

## 2019-01-01 MED ORDER — PANTOPRAZOLE SODIUM 40 MG PO TBEC: 40.0000 mg | DELAYED_RELEASE_TABLET | Freq: Two times a day (BID) | ORAL | 0 refills | Status: AC

## 2019-01-01 NOTE — Progress Notes (Signed)
5 Days Post-Op  Subjective: Tolerating diet well.  No abdominal pain reported.  Objective: Vital signs in last 24 hours: Temp:  [98.6 F (37 C)-99.2 F (37.3 C)] 98.6 F (37 C) (02/24 0458) Pulse Rate:  [61-66] 61 (02/24 0458) Resp:  [16-18] 18 (02/24 0458) BP: (125-147)/(59-83) 133/59 (02/24 0458) SpO2:  [93 %-97 %] 95 % (02/24 0458) Weight:  [83.2 kg] 83.2 kg (02/24 0458) Last BM Date: 12/30/18  Intake/Output from previous day: 02/23 0701 - 02/24 0700 In: 1533.4 [P.O.:720; I.V.:513.4; IV Piggyback:300] Out: 1600 [Urine:1600] Intake/Output this shift: No intake/output data recorded.  General appearance: no distress GI: soft, non-tender; bowel sounds normal; no masses,  no organomegaly  Lab Results:  Recent Labs    12/30/18 0614 12/31/18 0621  WBC 19.5* 17.1*  HGB 10.9* 9.3*  HCT 33.7* 29.4*  PLT 430* 386   BMET Recent Labs    12/30/18 0614 12/31/18 0621  NA 137 137  K 3.6 3.1*  CL 107 107  CO2 22 23  GLUCOSE 146* 212*  BUN 17 16  CREATININE 0.82 0.81  CALCIUM 8.1* 7.7*   PT/INR No results for input(s): LABPROT, INR in the last 72 hours.  Studies/Results: No results found.  Anti-infectives: Anti-infectives (From admission, onward)   Start     Dose/Rate Route Frequency Ordered Stop   12/30/18 1000  amoxicillin-clavulanate (AUGMENTIN) 875-125 MG per tablet 1 tablet     1 tablet Oral Every 12 hours 12/30/18 0756     12/26/18 1800  piperacillin-tazobactam (ZOSYN) IVPB 3.375 g  Status:  Discontinued     3.375 g 12.5 mL/hr over 240 Minutes Intravenous Every 8 hours 12/26/18 1727 12/30/18 0756   12/26/18 0600  piperacillin-tazobactam (ZOSYN) IVPB 3.375 g     3.375 g 12.5 mL/hr over 240 Minutes Intravenous  Once 12/26/18 0011 12/26/18 1255   12/25/18 2100  piperacillin-tazobactam (ZOSYN) IVPB 3.375 g     3.375 g 100 mL/hr over 30 Minutes Intravenous  Once 12/25/18 2047 12/25/18 2312      Assessment/Plan: s/p Procedure(s): ESOPHAGOGASTRODUODENOSCOPY  (EGD) WITH PROPOFOL BIOPSY Impression: Biliary colic, cholecystitis resolved.  Clinically patient's biliary colic has resolved. Plan: Patient's wife would like to take the patient home, which is fine with me.  Hopefully can avoid surgical intervention.  Patient's wife was instructed on a low-fat diet.  Will sign off.  LOS: 7 days    Franky Macho 01/01/2019

## 2019-01-01 NOTE — Discharge Instructions (Signed)
Fat and Cholesterol Restricted Eating Plan °Getting too much fat and cholesterol in your diet may cause health problems. Choosing the right foods helps keep your fat and cholesterol at normal levels. This can keep you from getting certain diseases. °Your doctor may recommend an eating plan that includes: °· Total fat: ______% or less of total calories a day. °· Saturated fat: ______% or less of total calories a day. °· Cholesterol: less than _________mg a day. °· Fiber: ______g a day. °What are tips for following this plan? °Meal planning °· At meals, divide your plate into four equal parts: °? Fill one-half of your plate with vegetables and green salads. °? Fill one-fourth of your plate with whole grains. °? Fill one-fourth of your plate with low-fat (lean) protein foods. °· Eat fish that is high in omega-3 fats at least two times a week. This includes mackerel, tuna, sardines, and salmon. °· Eat foods that are high in fiber, such as whole grains, beans, apples, broccoli, carrots, peas, and barley. °General tips ° °· Work with your doctor to lose weight if you need to. °· Avoid: °? Foods with added sugar. °? Fried foods. °? Foods with partially hydrogenated oils. °· Limit alcohol intake to no more than 1 drink a day for nonpregnant women and 2 drinks a day for men. One drink equals 12 oz of beer, 5 oz of wine, or 1½ oz of hard liquor. °Reading food labels °· Check food labels for: °? Trans fats. °? Partially hydrogenated oils. °? Saturated fat (g) in each serving. °? Cholesterol (mg) in each serving. °? Fiber (g) in each serving. °· Choose foods with healthy fats, such as: °? Monounsaturated fats. °? Polyunsaturated fats. °? Omega-3 fats. °· Choose grain products that have whole grains. Look for the word "whole" as the first word in the ingredient list. °Cooking °· Cook foods using low-fat methods. These include baking, boiling, grilling, and broiling. °· Eat more home-cooked foods. Eat at restaurants and buffets  less often. °· Avoid cooking using saturated fats, such as butter, cream, palm oil, palm kernel oil, and coconut oil. °Recommended foods ° °Fruits °· All fresh, canned (in natural juice), or frozen fruits. °Vegetables °· Fresh or frozen vegetables (raw, steamed, roasted, or grilled). Green salads. °Grains °· Whole grains, such as whole wheat or whole grain breads, crackers, cereals, and pasta. Unsweetened oatmeal, bulgur, barley, quinoa, or brown rice. Corn or whole wheat flour tortillas. °Meats and other protein foods °· Ground beef (85% or leaner), grass-fed beef, or beef trimmed of fat. Skinless chicken or turkey. Ground chicken or turkey. Pork trimmed of fat. All fish and seafood. Egg whites. Dried beans, peas, or lentils. Unsalted nuts or seeds. Unsalted canned beans. Nut butters without added sugar or oil. °Dairy °· Low-fat or nonfat dairy products, such as skim or 1% milk, 2% or reduced-fat cheeses, low-fat and fat-free ricotta or cottage cheese, or plain low-fat and nonfat yogurt. °Fats and oils °· Tub margarine without trans fats. Light or reduced-fat mayonnaise and salad dressings. Avocado. Olive, canola, sesame, or safflower oils. °The items listed above may not be a complete list of foods and beverages you can eat. Contact a dietitian for more information. °Foods to avoid °Fruits °· Canned fruit in heavy syrup. Fruit in cream or butter sauce. Fried fruit. °Vegetables °· Vegetables cooked in cheese, cream, or butter sauce. Fried vegetables. °Grains °· White bread. White pasta. White rice. Cornbread. Bagels, pastries, and croissants. Crackers and snack foods that contain trans fat   and hydrogenated oils. Meats and other protein foods  Fatty cuts of meat. Ribs, chicken wings, bacon, sausage, bologna, salami, chitterlings, fatback, hot dogs, bratwurst, and packaged lunch meats. Liver and organ meats. Whole eggs and egg yolks. Chicken and [place] with skin. Fried meat. Dairy  Whole or 2% milk, cream,  half-and-half, and cream cheese. Whole milk cheeses. Whole-fat or sweetened yogurt. Full-fat cheeses. Nondairy creamers and whipped toppings. Processed cheese, cheese spreads, and cheese curds. Beverages  Alcohol. Sugar-sweetened drinks such as sodas, lemonade, and fruit drinks. Fats and oils  Butter, stick margarine, lard, shortening, ghee, or bacon fat. Coconut, palm kernel, and palm oils. Sweets and desserts  Corn syrup, sugars, honey, and molasses. Candy. Jam and jelly. Syrup. Sweetened cereals. Cookies, pies, cakes, donuts, muffins, and ice cream. The items listed above may not be a complete list of foods and beverages you should avoid. Contact a dietitian for more information. Summary  Choosing the right foods helps keep your fat and cholesterol at normal levels. This can keep you from getting certain diseases.  At meals, fill one-half of your plate with vegetables and green salads.  Eat high-fiber foods, like whole grains, beans, apples, carrots, peas, and barley.  Limit added sugar, saturated fats, alcohol, and fried foods. This information is not intended to replace advice given to you by your health care provider. Make sure you discuss any questions you have with your health care provider. Document Released: 04/25/2012 Document Revised: 06/28/2018 Document Reviewed: 07/12/2017 Elsevier Interactive Patient Education  2019 [employer].   Cholelithiasis  Cholelithiasis is also called "gallstones." It is a kind of gallbladder disease. The gallbladder is an organ that stores a liquid (bile) that helps you digest fat. Gallstones may not cause symptoms (may be silent gallstones) until they cause a blockage, and then they can cause pain (gallbladder attack). Follow these instructions at home:  Take over-the-counter and prescription medicines only as told by your doctor.  Stay at a healthy weight.  Eat healthy foods. This includes: ? Eating fewer fatty foods, like fried  foods. ? Eating fewer refined carbs (refined carbohydrates). Refined carbs are breads and grains that are highly processed, like white bread and white rice. Instead, choose whole grains like whole-wheat bread and brown rice. ? Eating more fiber. Almonds, fresh fruit, and beans are healthy sources of fiber.  Keep all follow-up visits as told by your doctor. This is important. Contact a doctor if:  You have sudden pain in the upper right side of your belly (abdomen). Pain might spread to your right shoulder or your chest. This may be a sign of a gallbladder attack.  You feel sick to your stomach (are nauseous).  You throw up (vomit).  You have been diagnosed with gallstones that have no symptoms and you get: ? Belly pain. ? Discomfort, burning, or fullness in the upper part of your belly (indigestion). Get help right away if:  You have sudden pain in the upper right side of your belly, and it lasts for more than 2 hours.  You have belly pain that lasts for more than 5 hours.  You have a fever or chills.  You keep feeling sick to your stomach or you keep throwing up.  Your skin or the whites of your eyes turn yellow (jaundice).  You have dark-colored pee (urine).  You have light-colored poop (stool). Summary  Cholelithiasis is also called "gallstones."  The gallbladder is an organ that stores a liquid (bile) that helps you digest fat.  Silent gallstones are gallstones that do not cause symptoms.  A gallbladder attack may cause sudden pain in the upper right side of your belly. Pain might spread to your right shoulder or your chest. If this happens, contact your doctor.  If you have sudden pain in the upper right side of your belly that lasts for more than 2 hours, get help right away. This information is not intended to replace advice given to you by your health care provider. Make sure you discuss any questions you have with your health care provider. Document Released:  04/12/2008 Document Revised: 07/11/2016 Document Reviewed: 07/11/2016 Elsevier Interactive Patient Education  2019 [employer] Inc[employer]   IMPORTANT INFORMATION: PAY CLOSE ATTENTION   PHYSICIAN DISCHARGE INSTRUCTIONS  Follow with Primary care provider  Kela Millin, MD  and other consultants as instructed your Hospitalist Physician  SEEK MEDICAL CARE OR RETURN TO EMERGENCY ROOM IF SYMPTOMS COME BACK, WORSEN OR NEW PROBLEM DEVELOPS.   Please note: You were cared for by a hospitalist during your hospital stay. Every effort will be made to forward records to your primary care provider.  You can request that your primary care provider send for your hospital records if they have not received them.  Once you are discharged, your primary care physician will handle any further medical issues. Please note that NO REFILLS for any discharge medications will be authorized once you are discharged, as it is imperative that you return to your primary care physician (or establish a relationship with a primary care physician if you do not have one) for your post hospital discharge needs so that they can reassess your need for medications and monitor your lab values.  Please get a complete blood count and chemistry panel checked by your Primary MD at your next visit, and again as instructed by your Primary MD.  Get Medicines reviewed and adjusted: Please take all your medications with you for your next visit with your Primary MD  Laboratory/radiological data: Please request your Primary MD to go over all hospital tests and procedure/radiological results at the follow up, please ask your primary care provider to get all Hospital records sent to his/her office.  In some cases, they will be blood work, cultures and biopsy results pending at the time of your discharge. Please request that your primary care provider follow up on these results.  If you are diabetic, please bring your blood sugar readings with you to  your follow up appointment with primary care.    Please call and make your follow up appointments as soon as possible.    Also Note the following: If you experience worsening of your admission symptoms, develop shortness of breath, life threatening emergency, suicidal or homicidal thoughts you must seek medical attention immediately by calling 911 or calling your MD immediately  if symptoms less severe.  You must read complete instructions/literature along with all the possible adverse reactions/side effects for all the Medicines you take and that have been prescribed to you. Take any new Medicines after you have completely understood and accpet all the possible adverse reactions/side effects.   Do not drive when taking Pain medications or sleeping medications (Benzodiazepines)  Do not take more than prescribed Pain, Sleep and Anxiety Medications. It is not advisable to combine anxiety,sleep and pain medications without talking with your primary care practitioner  Special Instructions: If you have smoked or chewed Tobacco  in the last 2 yrs please stop smoking, stop any regular Alcohol  and or any  Recreational drug use.  Wear Seat belts while driving.

## 2019-01-01 NOTE — Progress Notes (Signed)
Initial Nutrition Assessment  DOCUMENTATION CODES:     INTERVENTION:  Boost Breeze po TID, each supplement provides 250 kcal and 9 grams of protein   Heart Healthy diet  NUTRITION DIAGNOSIS:   Inadequate oral intake related to poor appetite as evidenced by energy intake < or equal to 50% for > or equal to 5 days.   GOAL:    Patient will meet greater than or equal to 90% of their needs  MONITOR: Po intake, labs and wt trends     REASON FOR ASSESSMENT:  Consult Poor PO  ASSESSMENT: Patient is a 78 yo male with hx of dementia, diabetes (diet controlled), CHF, HTN. Patient admitted on 2/17 abdominal pain, melena and poor intake 2 days PTA.  2/19- EGD/biopsy- Dr Jena Gauss  Patient is sleeping on RD arrival but spouse is here and provided intake history. Most documented meals 0-5% intake except one time during 7 day admission. He ate frosted flakes this morning which is more than he has consumed previously this week.  At home he likes cereal and watermelon. He doesn't like Ensure, Glucerna or carnation instant breakfast. He will drink Primer Protein shakes and she gives him those. Encouraged her to consider adding a MVI daily since he has not been eating well during hospital stay.   During RD interview pt spouse says they are being discharged today. She is hopeful he will resume his usual intake when he is in more familiar surroundings.  Patient has been provided Fat and Cholesterol Restricted Eating Plan information as part of discharge instructions.  His intake during hospital stay is meeting less than 50% of his energy needs for >/= 5 days. No acute loss of weight found but likely masked by IV fluids.   His weight has increased from 81- 83.2 kg this week. Usual weight reported as (80 kg) 176 lb -1 week prior to hospitalization- taken at primary MD office.    Labs reviewed: Potassium 3.1 (L), Glucose 212 (H), Albumin 2.1 (L), WBC- 17.1 (H).  Medications reviewed and include:  Namenda, Remeron, Augmentin, Florastor, Protonix and zoloft   Diet Order:   Diet Order            Diet Heart Room service appropriate? Yes; Fluid consistency: Thin  Diet effective now              EDUCATION NEEDS: addressedEducation needs have been addressed  Skin:  Skin Assessment: Skin Integrity Issues: Skin Integrity Issues:: DTI  Last BM:  2/22  Height:   Ht Readings from Last 1 Encounters:  12/26/18 5\' 11"  (1.803 m)    Weight:   Wt Readings from Last 1 Encounters:  01/01/19 83.2 kg    Ideal Body Weight:  78 kg  BMI:  Body mass index is 25.58 kg/m.  Estimated Nutritional Needs:   Kcal:  1194-1740 (to prevent unwanted wt loss)  Protein:  96-99 gr  Fluid:  1.9-2.1 liters daily    Royann Shivers MS,RD,CSG,LDN Office: [phone] Pager: [phone]

## 2019-01-01 NOTE — ACP (Advance Care Planning) (Signed)
Richard Eaton completed his HCPOA and a copy was placed in his chart, along with original and copies that went home when he was discharged.

## 2019-01-01 NOTE — Care Management Note (Signed)
Case Management Note  Patient Details  Name: Richard Eaton MRN: [id number] Date of Birth: 10/30/1941  Subjective/Objective:                    Action/Plan:  Patient discharging home today. Patient and wife discussed home health options with Shanda Bumps, CM.  They elected [facility]. Referral given to [facility].    Expected Discharge Date:  01/01/19               Expected Discharge Plan:  Home w Home Health Services  In-House Referral:     Discharge planning Services  CM Consult  Post Acute Care Choice:  Home Health Choice offered to:  Patient, Spouse  DME Arranged:    DME Agency:     HH Arranged:  RN, PT, Social Work [employer] Agency:  [facility]  Status of Service:  Completed, signed off  If discussed at [employer], dates discussed:    Additional Comments:  Annissa Andreoni, Chrystine Oiler, RN 01/01/2019, 12:59 PM

## 2019-01-01 NOTE — Consult Note (Addendum)
WOC consult requested for buttocks/sacrum wounds.   Consult performed via phone call with the bedside nurse; who assessed and measured the wounds and described the appearance.  Left buttock 2X2X.1cm with dark reddish-purple deep tissue pressure injury which is beginning to evolve; outer layer of skin has peeled off, small amt tan drainage, no odor or fluctuance Right buttock with unstageable pressure injury; 2X1.5X.1cm, 100% yellow slough, small amt tan drainage, no odor or fluctuance. Entire bilat buttocks and sacrum is affected with red, moist partial thickness skin loss related to moisture associated skin damage; 10X7cm.  Pt is frequently incontinent and it is difficult to keep the wound from becoming soiled.  Plan: Topical treatment orders provided for bedside nurses to perform daily; Santyl to provide enzymatic debridement of nonviable tissue to bilat buttocks.  Please re-consult if further assistance is needed.  Thank-you,  Cammie Mcgee MSN, RN, CWOCN, [place], CNS [phone]

## 2019-01-01 NOTE — Discharge Summary (Addendum)
Physician Discharge Summary  Richard Eaton RUE:[id number] DOB: [birth year] DOA: 12/25/2018  PCP: Kela Millin, MD GI: [facility]  Admit date: 12/25/2018 Discharge date: 01/01/2019  Admitted From: Home  Disposition: Home with [facility] services   Recommendations for Outpatient Follow-up:  1. Follow up with PCP in 1 weeks 2. Follow up with neurologist in 2 weeks 3. Consider outpatient palliative medicine referral.   Home Health: PT, RN, SW   Discharge Condition: STABLE   CODE STATUS: FULL    Brief Hospitalization Summary: Please see all hospital notes, images, labs for full details of the hospitalization. Dr. Francesco Runner HPI: Richard Eaton is a 78 y.o. male with medical history significant for dementia, hypertension, and diet-controlled diabetes mellitus, now presenting to the emergency department for evaluation of severe abdominal pain, nausea, loss of appetite, and dark tarry stools.  He is accompanied by his wife who assists with the history.  He began to complain of severe pain in the right upper abdomen approximately 5 days ago, has been eating very little since that time due to worsening pain, has had nausea but vomited only early in the course, and has had dark "black" tarry and oily stool for 2 days, but no BM today.  Patient's wife reports that she has been trying to convince him to come to the ED for evaluation of this for a few days but he had been refusing until this evening.  He has not vomited in the past couple days and there was no hematemesis noted.  Patient's wife reports that he has been febrile at home.  He has not been coughing or complaining of dysuria.  ED Course: Upon arrival to the ED, patient is found to be afebrile, saturating well on room air, and with vitals otherwise normal.  Chemistry panel is notable for potassium of 3.3, transaminases in the 100 range, BUN of 54, up from 17 earlier this month, and with creatinine of 1.08.  CBC is notable for leukocytosis to 18,800 and a  normocytic anemia with hemoglobin 11.9.  Fecal occult blood testing was negative.  CT the abdomen and pelvis reveals a dilated gallbladder with multiple calcified stones and suspected pericholecystic fluid and mild inflammatory changes concerning for acute cholecystitis.  Surgery was consulted by the ED physician and recommended medical admission, indicating they will plan evaluate the patient in the morning.  The patient was treated with a liter of normal saline, Zofran, morphine, and Zosyn.  He remains hemodynamically stable and will be admitted for further evaluation and management.  Brief Narrative:  78 year old male with history of dementia, hypertension and diet-controlled diabetes, presented to the hospital with severe right upper quadrant pain, nausea and vomiting for approximately 5 days.  He also had dark tarry stools for 2 days prior to admission.  Work-up in the emergency room indicated possible acute cholecystitis.  He was started on intravenous antibiotics and seen by general surgery.  Family is considering surgical options.  He was also seen by GI for melena and underwent EGD with results as below.  Assessment & Plan:   Principal Problem:   Acute cholecystitis Active Problems:   Alzheimer disease (HCC)   Hypertension   Diet-controlled diabetes mellitus (HCC)   Melena   Hypokalemia   Pressure injury of skin   Elevated transaminase level   RUQ pain   Leukocytosis  1. Acute cholecystitis.  Resolved.  Seen by general surgery.   Pt is HIGH RISK for surgical intervention and he is being managed conservatively now.  He  is tolerating diet but appetite remains poor.  Consider palliative medicine consult for goals of care and comfort if he is still here on Monday.  His prognosis is poor if his eating and drinking does not improve.  I discussed with wife at length.  She verbalized understanding.  2. Melena.  Resolved now.  Patient had dark tarry stools prior to admission.  BUN was  elevated.  Hemoglobin being followed.  EGD showed gastric erosions which is likely etiology of bleeding.  Continue on PPI medical therapy.  Holding aspirin.  3. Pneumonia - completing course of oral augmentin.   4. Liver cirrhosis - He will need outpatient follow up with GI.   5. Leukocytosis -  Clinically he is nontoxic. Flu test negative.  Treating pneumonia.   6. Type 2 diabetes mellitus.  Diet controlled at home.  A1c 7.4. 7. Dementia.  He is starting to sundown.  Continue on Namenda.  Wife remains at bedside.  8. Hypertension.  Continue on losartan and amlodipine.     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 - Medium-sized hiatal hernia. Gastric erosions/Cameron lesions. Stomach could have easily bled recently. Status post biopsy. - Normal duodenal bulb and second portion of the duodenum. No stigmata of chronic liver disease seen on today's EGD.  Antimicrobials:   Zosyn 2/17 >2/22  augmentin 2/22 >  Discharge Diagnoses:  Principal Problem:   Acute cholecystitis Active Problems:   Alzheimer disease (HCC)   Hypertension   Diet-controlled diabetes mellitus (HCC)   Melena   Hypokalemia   Pressure injury of skin   Elevated transaminase level   RUQ pain   Leukocytosis   Discharge Instructions: Discharge Instructions    Call MD for:  difficulty breathing, headache or visual disturbances   Complete by:  As directed    Call MD for:  extreme fatigue   Complete by:  As directed    Call MD for:  persistant dizziness or light-headedness   Complete by:  As directed    Call MD for:  severe uncontrolled pain   Complete by:  As directed    Increase activity slowly   Complete by:  As  directed      Allergies as of 01/01/2019   No Known Allergies     Medication List    STOP taking these medications   aspirin EC 81 MG tablet   ibuprofen 200 MG tablet Commonly known as:  ADVIL,MOTRIN     TAKE these medications   acetaminophen 325 MG tablet Commonly known as:  TYLENOL Take 2 tablets (650 mg total) by mouth every 4 (four) hours as needed for mild pain, fever or headache. What changed:    medication strength  how much to take  when to take this  reasons to take this   amLODipine 10 MG tablet Commonly known as:  NORVASC Take 10 mg by mouth at bedtime.   amoxicillin-clavulanate 875-125 MG tablet Commonly known as:  AUGMENTIN Take 1 tablet by mouth every 12 (twelve) hours for 5 days.   collagenase ointment Commonly known as:  SANTYL Apply topically daily.   losartan 100 MG tablet Commonly known as:  COZAAR Take 1 tablet by mouth at bedtime.   memantine 10 MG tablet Commonly known as:  NAMENDA Take 1 tablet (10 mg total) by mouth 2 (two) times daily.  mirtazapine 15 MG tablet Commonly known as:  REMERON Take 15 mg by mouth at bedtime.   ondansetron 4 MG tablet Commonly known as:  ZOFRAN Take 4 mg by mouth every 12 (twelve) hours as needed for nausea or vomiting.   oxyCODONE 5 MG immediate release tablet Commonly known as:  Oxy IR/ROXICODONE Take 0.5-1 tablets (2.5-5 mg total) by mouth every 4 (four) hours as needed for moderate pain or severe pain.   pantoprazole 40 MG tablet Commonly known as:  PROTONIX Take 1 tablet (40 mg total) by mouth 2 (two) times daily for 30 days.   promethazine 25 MG tablet Commonly known as:  PHENERGAN Take 12.5-25 mg by mouth daily as needed for nausea or vomiting.   saccharomyces boulardii 250 MG capsule Commonly known as:  FLORASTOR Take 1 capsule (250 mg total) by mouth 2 (two) times daily for 30 days.   sertraline 50 MG tablet Commonly known as:  ZOLOFT Take 2 tablets (100 mg total) by mouth every  morning.   tamsulosin 0.4 MG Caps capsule Commonly known as:  FLOMAX Take 0.4 mg by mouth at bedtime.   triamcinolone cream 0.1 % Commonly known as:  KENALOG Apply 1 application topically daily as needed (for skin irritation).      Follow-up Information    Kela Millin, MD. Schedule an appointment as soon as possible for a visit in 1 week(s).   Specialty:  Family Medicine Contact information: [address] [place] [address] [phone]        Franky Macho, MD. Call.   Specialty:  General Surgery Contact information: [address] [place] [address] [phone]        York Spaniel, MD. Schedule an appointment as soon as possible for a visit in 2 week(s).   Specialty:  Neurology Contact information: [address] [place] [address] [phone]        West Bali, MD. Schedule an appointment as soon as possible for a visit in 1 month(s).   Specialty:  Gastroenterology Contact information: [address] [place] [address] [phone]          No Known Allergies Allergies as of 01/01/2019   No Known Allergies     Medication List    STOP taking these medications   aspirin EC 81 MG tablet   ibuprofen 200 MG tablet Commonly known as:  ADVIL,MOTRIN     TAKE these medications   acetaminophen 325 MG tablet Commonly known as:  TYLENOL Take 2 tablets (650 mg total) by mouth every 4 (four) hours as needed for mild pain, fever or headache. What changed:    medication strength  how much to take  when to take this  reasons to take this   amLODipine 10 MG tablet Commonly known as:  NORVASC Take 10 mg by mouth at bedtime.   amoxicillin-clavulanate 875-125 MG tablet Commonly known as:  AUGMENTIN Take 1 tablet by mouth every 12 (twelve) hours for 5 days.   collagenase ointment Commonly known as:  SANTYL Apply topically daily.   losartan 100 MG tablet Commonly known as:  COZAAR Take 1  tablet by mouth at bedtime.   memantine 10 MG tablet Commonly known as:  NAMENDA Take 1 tablet (10 mg total) by mouth 2 (two) times daily.   mirtazapine 15 MG tablet Commonly known as:  REMERON Take 15 mg by mouth at bedtime.   ondansetron 4 MG tablet Commonly known as:  ZOFRAN Take 4  mg by mouth every 12 (twelve) hours as needed for nausea or vomiting.   oxyCODONE 5 MG immediate release tablet Commonly known as:  Oxy IR/ROXICODONE Take 0.5-1 tablets (2.5-5 mg total) by mouth every 4 (four) hours as needed for moderate pain or severe pain.   pantoprazole 40 MG tablet Commonly known as:  PROTONIX Take 1 tablet (40 mg total) by mouth 2 (two) times daily for 30 days.   promethazine 25 MG tablet Commonly known as:  PHENERGAN Take 12.5-25 mg by mouth daily as needed for nausea or vomiting.   saccharomyces boulardii 250 MG capsule Commonly known as:  FLORASTOR Take 1 capsule (250 mg total) by mouth 2 (two) times daily for 30 days.   sertraline 50 MG tablet Commonly known as:  ZOLOFT Take 2 tablets (100 mg total) by mouth every morning.   tamsulosin 0.4 MG Caps capsule Commonly known as:  FLOMAX Take 0.4 mg by mouth at bedtime.   triamcinolone cream 0.1 % Commonly known as:  KENALOG Apply 1 application topically daily as needed (for skin irritation).       Procedures/Studies: Ct Abdomen Pelvis W Contrast  Result Date: 12/25/2018 CLINICAL DATA:  Abdominal pain with fever EXAM: CT ABDOMEN AND PELVIS WITH CONTRAST TECHNIQUE: Multidetector CT imaging of the abdomen and pelvis was performed using the standard protocol following bolus administration of intravenous contrast. CONTRAST:  OMNIPAQUE IOHEXOL 300 MG/ML  SOLN COMPARISON:  None. FINDINGS: Lower chest: Small right-sided pleural effusion. Partial atelectasis in the right lower lobe. The heart size is within normal limits. Small hiatal hernia. Hepatobiliary: Distended gallbladder with multiple calcified stones. Mild soft  tissue stranding in the right upper quadrant adjacent to the gallbladder. Perihepatic fluid. Probable cyst at the porta hepatis. No definite biliary enlargement. Pancreas: Unremarkable. No pancreatic ductal dilatation or surrounding inflammatory changes. Spleen: Normal in size without focal abnormality. Adrenals/Urinary Tract: Adrenal glands are normal. Parapelvic cysts. No hydronephrosis. Air within the bladder. Possible small gas containing diverticulum off the anterior wall of the bladder. Stomach/Bowel: Stomach is nonenlarged. No dilated small bowel. No colon wall thickening. Negative appendix. Sigmoid colon diverticula. Vascular/Lymphatic: Moderate aortic atherosclerosis. No aneurysm. No significantly enlarged lymph nodes. Reproductive: Enlarged prostate. Fluid-filled defect within the prostate which may be postsurgical. Other: No free air. Trace free fluid in the pelvis. Small fat containing supraumbilical ventral hernia. Musculoskeletal: Degenerative changes. No acute or suspicious abnormality. IMPRESSION: 1. Dilated gallbladder with multiple calcified stones and suspected pericholecystic fluid and mild inflammatory changes raising concern for an acute cholecystitis. Suggest correlation with ultrasound. There is perihepatic fluid, which may be secondary to inflammatory process from the gallbladder. 2. Small right pleural effusion and partial atelectasis at the right base. 3. Bilateral parapelvic renal cysts 4. Small fat containing supraumbilical ventral hernia Electronically Signed   By: Jasmine Pang M.D.   On: 12/25/2018 22:49   Dg Chest Port 1 View  Result Date: 12/29/2018 CLINICAL DATA:  Leukocytosis. EXAM: PORTABLE CHEST 1 VIEW COMPARISON:  05/06/2018. FINDINGS: Patient is rotated to the left. Mediastinum and hilar structures normal. Cardiomegaly. Mild right base atelectasis/infiltrate. Tiny left pleural effusion versus pleural scarring. No pneumothorax. IMPRESSION: 1.  Mild right base  atelectasis/infiltrate. 2. Cardiomegaly. Tiny left pleural effusion versus pleural scarring. Electronically Signed   By: Maisie Fus  Register   On: 12/29/2018 08:23   US Abdomen Limited Ruq  Result Date: 12/26/2018 CLINICAL DATA:  Right upper quadrant pain.  Abnormal LFTs. EXAM: ULTRASOUND ABDOMEN LIMITED RIGHT UPPER QUADRANT COMPARISON:  CT abdomen and  pelvis 12/25/2018 FINDINGS: Gallbladder: Numerous small stones in the gallbladder without a dominant, discretely measurable stone. Mild gallbladder wall thickening measuring 4 mm. Pericholecystic fluid. No sonographic Murphy sign noted by sonographer. Common bile duct: Diameter: 4 mm Liver: Mildly increased parenchymal echogenicity diffusely with mildly nodular liver contour. No focal lesion identified. Small volume perihepatic free fluid. Portal vein is patent on color Doppler imaging with normal direction of blood flow towards the liver. IMPRESSION: 1. Numerous small gallstones with mild gallbladder wall thickening. This may reflect acute cholecystitis in the appropriate clinical setting (particularly given the gallbladder distension and regional inflammatory changes on CT) although there was no sonographic Murphy sign. Nuclear medicine HIDA scan could be considered to evaluate cystic duct patency of clinically indicated. 2. Mildly echogenic liver, nonspecific though may reflect steatosis or chronic hepatitis with early cirrhosis possible given mildly nodular liver contour. Chronic liver disease could also contribute to gallbladder wall thickening. 3. No biliary dilatation. Electronically Signed   By: Sebastian Ache M.D.   On: 12/26/2018 09:27    Subjective: Pt about the same today, he was eating cookies last night per wife, no emesis and no diarrhea.  He only rarely complains of pain.   Discharge Exam: Vitals:   12/31/18 2252 01/01/19 0458  BP: 125/83 (!) 133/59  Pulse: 66 61  Resp: 16 18  Temp: 99.2 F (37.3 C) 98.6 F (37 C)  SpO2: 97% 95%    Vitals:   12/31/18 0601 12/31/18 1250 12/31/18 2252 01/01/19 0458  BP: (!) 136/59 (!) 147/61 125/83 (!) 133/59  Pulse: (!) 55 65 66 61  Resp: Temp: 98.3 F (36.8 C)  99.2 F (37.3 C) 98.6 F (37 C)  TempSrc: Oral  Oral Oral  SpO2: 97% 93% 97% 95%  Weight:    83.2 kg  Height:       General exam: Alert, awake, no distress Respiratory system: Clear to auscultation. Respiratory effort normal. Cardiovascular system: normal s1,s2 sounds. No murmurs, rubs, gallops. Gastrointestinal system: Abdomen is nondistended, soft and nontender. No organomegaly or masses felt. Normal bowel sounds heard. Central nervous system: No focal neurological deficits. Extremities: No C/C/E, +pedal pulses Skin: No rashes, lesions or ulcers Psychiatry: Judgement and insight appear normal. Mood & affect appropriate.    The results of significant diagnostics from this hospitalization (including imaging, microbiology, ancillary and laboratory) are listed below for reference.     Microbiology: No results found for this or any previous visit (from the past 240 hour(s)).   Labs: BNP (last 3 results) No results for input(s): BNP in the last 8760 hours. Basic Metabolic Panel: Recent Labs  Lab 12/26/18 0539 12/27/18 0420 12/28/18 0420 12/29/18 0521 12/30/18 0614 12/31/18 0621  NA 137 138 138 136 137 137  K 3.3* 3.2* 3.4* 3.3* 3.6 3.1*  CL 106 106 107 106 107 107  CO2 21* GLUCOSE 148* 126* 176* 133* 146* 212*  BUN 52* 39* 29* CREATININE 0.99 1.02 0.93 0.90 0.82 0.81  CALCIUM 8.5* 8.2* 8.0* 7.7* 8.1* 7.7*  MG 2.2  --   --  2.2  --   --    Liver Function Tests: Recent Labs  Lab 12/27/18 0420 12/28/18 0420 12/29/18 0521 12/30/18 0614 12/31/18 0621  AST 49* 71* 63* 52* 26  ALT 64* 69* 74* 70* 46*  ALKPHOS 97 95 119 154* 107  BILITOT 0.6 0.6 0.8 0.8 0.3  PROT 6.2* 6.4* 5.9* 6.7  5.9*  ALBUMIN 2.5* 2.5* 2.2* 2.5* 2.1*   Recent Labs  Lab 12/25/18 1621  12/26/18 0539  LIPASE 15 14   No results for input(s): AMMONIA in the last 168 hours. CBC: Recent Labs  Lab 12/26/18 0539 12/27/18 0420 12/28/18 0420 12/29/18 0521 12/30/18 0614 12/31/18 0621  WBC 16.1* 18.6* 16.1* 16.0* 19.5* 17.1*  NEUTROABS 13.0*  --   --  13.0* 15.6* 14.1*  HGB 10.6* 9.8* 10.1* 10.1* 10.9* 9.3*  HCT 32.6* 30.2* 31.9* 32.1* 33.7* 29.4*  MCV 93.9 95.6 95.5 93.9 93.9 93.6  PLT 266 316 354 354 430* 386   Cardiac Enzymes: No results for input(s): CKTOTAL, CKMB, CKMBINDEX, TROPONINI in the last 168 hours. BNP: Invalid input(s): POCBNP CBG: Recent Labs  Lab 12/30/18 1130 12/30/18 1633 12/30/18 1954 12/31/18 0722 01/01/19 0729  GLUCAP 144* 151* 125* 180* 137*   D-Dimer No results for input(s): DDIMER in the last 72 hours. Hgb A1c No results for input(s): HGBA1C in the last 72 hours. Lipid Profile No results for input(s): CHOL, HDL, LDLCALC, TRIG, CHOLHDL, LDLDIRECT in the last 72 hours. Thyroid function studies No results for input(s): TSH, T4TOTAL, T3FREE, THYROIDAB in the last 72 hours.  Invalid input(s): FREET3 Anemia work up No results for input(s): VITAMINB12, FOLATE, FERRITIN, TIBC, IRON, RETICCTPCT in the last 72 hours. Urinalysis No results found for: COLORURINE, APPEARANCEUR, LABSPEC, PHURINE, GLUCOSEU, HGBUR, BILIRUBINUR, KETONESUR, PROTEINUR, UROBILINOGEN, NITRITE, LEUKOCYTESUR Sepsis Labs Invalid input(s): PROCALCITONIN,  WBC,  LACTICIDVEN Microbiology No results found for this or any previous visit (from the past 240 hour(s)).  Time coordinating discharge: 36 mins   SIGNED:  Standley Dakinslanford Gorge Almanza, MD  [facility] 01/01/2019, 11:24 AM How to contact the [facility] Attending or Consulting provider 7A - 7P or covering provider during after hours 7P -7A, for this patient?  1. Check the care team in [facility] and look for a) attending/consulting [facility] provider listed and b) the [facility] team listed 2. Log into [email or web address] and use [place]'s  universal password to access. If you do not have the password, please contact the hospital operator. 3. Locate the [facility] provider you are looking for under [facility] and page to a number that you can be directly reached. 4. If you still have difficulty reaching the provider, please page the [facility]DOC (Director on Call) for the Hospitalists listed on amion for assistance.

## 2019-01-01 NOTE — Progress Notes (Signed)
IV removed and discharge instructions reviewed.  Scripts sent to pharmacy.  EMS called for transport home

## 2019-01-01 NOTE — Care Management Important Message (Signed)
Important Message  Patient Details  Name: Richard Eaton MRN: [id number] Date of Birth: 14-Sep-1941   Medicare Important Message Given:  Yes    Corey Harold 01/01/2019, 1:56 PM

## 2019-02-06 ENCOUNTER — Telehealth: Payer: Self-pay

## 2019-02-06 ENCOUNTER — Other Ambulatory Visit: Payer: Self-pay

## 2019-02-06 ENCOUNTER — Ambulatory Visit: Payer: Medicare Other | Admitting: Internal Medicine

## 2019-02-06 NOTE — Telephone Encounter (Signed)
Tried to call pt at 2:35pm for telephone visit, call went straight to VM. LMOVM for return call. Tried to call pt again at 2:45pm, call went straight to VM. Dr. Jena Gauss aware.

## 2019-02-06 NOTE — Telephone Encounter (Signed)
Tried to call pt at 3:50pm, call went straight to VM. Dr. Jena Gauss aware.  Routing to [place] to no show for telephone visit.

## 2019-02-07 ENCOUNTER — Telehealth: Payer: Self-pay | Admitting: Internal Medicine

## 2019-02-07 ENCOUNTER — Encounter: Payer: Self-pay | Admitting: Internal Medicine

## 2019-02-07 NOTE — Telephone Encounter (Signed)
PATIENT DID NOT ANSWER HIS PHONE FOR OFFICE VISIT AND LETTER SENT

## 2019-02-12 ENCOUNTER — Encounter: Payer: Self-pay | Admitting: Internal Medicine

## 2019-02-12 NOTE — Telephone Encounter (Signed)
MAILED NO SHOW LETTER

## 2019-03-06 ENCOUNTER — Ambulatory Visit: Payer: Medicare Other | Admitting: Gastroenterology

## 2019-04-09 ENCOUNTER — Telehealth: Payer: Self-pay | Admitting: Neurology

## 2019-04-09 MED ORDER — SERTRALINE HCL 100 MG PO TABS: 150.0000 mg | ORAL_TABLET | Freq: Every day | ORAL | 3 refills | Status: AC

## 2019-04-09 MED ORDER — MEMANTINE HCL ER 28 MG PO CP24: 28.0000 mg | ORAL_CAPSULE | Freq: Every day | ORAL | 1 refills | Status: AC

## 2019-04-09 NOTE — Telephone Encounter (Signed)
Pt's wife called and stated that the pts sertraline (ZOLOFT) 50 MG tablet was increased to 100 mg on the last visit but she states that the pt has been getting worse and she would like to know if it can be increased again. Please advise.

## 2019-04-09 NOTE — Telephone Encounter (Signed)
I called the patient. I talked with the wife. The patient has had an increase in some of his anxiety, he has not truly agitated, but he is continuing calling out for his wife.  She is not sleeping well at night.  We will increase the Zoloft 150 mg daily, we may need to add alprazolam in the future.  The patient will be switched from the 10 mg Namenda to 28 mg extended release capsule.  The patient will follow-up in July.  The patient has his days and nights mixed up, he will sometimes sleep well at night and other times not sleep well at all.

## 2019-05-28 ENCOUNTER — Telehealth: Payer: Self-pay | Admitting: Neurology

## 2019-05-28 NOTE — Telephone Encounter (Signed)
I reached out to the pt's wife. She states the pt is currently under hospice care. She and I discussed appt with SS, NP on 05/29/19 and she thought it was best at this time to cancel. She is going to check with the attending MD to confirm if he or she will continue to prescribe the sertraline and namenda. I advised the pts wife if she needed anything from our office to please call and she was agreeable.

## 2019-05-28 NOTE — Telephone Encounter (Signed)
Called pt for reminder call- advised the pt is now under hospice care but Richard Eaton(pts wife) would like a call to discuss if this appt is still needed. If needed she request to have a VV due to the pt unable to get around easily. Please advise

## 2019-05-28 NOTE — Telephone Encounter (Signed)
Events noted

## 2019-05-29 ENCOUNTER — Ambulatory Visit: Payer: Medicare Other | Admitting: Neurology

## 2019-07-10 DEATH — deceased

## 2019-07-11 IMAGING — CT CT ABD-PELV W/ CM
2 of 4 series · 16 of 46 positions shown, 18 images · IV contrast (Isovue)
Comparison: None.

CLINICAL DATA: Abdominal pain with fever

EXAM:
CT ABDOMEN AND PELVIS WITH CONTRAST
TECHNIQUE: Multidetector CT imaging of the abdomen and pelvis was performed
using the standard protocol following bolus administration of
intravenous contrast.
CONTRAST:  100mL OMNIPAQUE IOHEXOL 300 MG/ML  SOLN

[Series 3: axial st · axial · 0.89mm/px · z∈[-287,+128]mm · 13 of 93 slices shown, 15 images]
[im 5/93  soft-tissue]
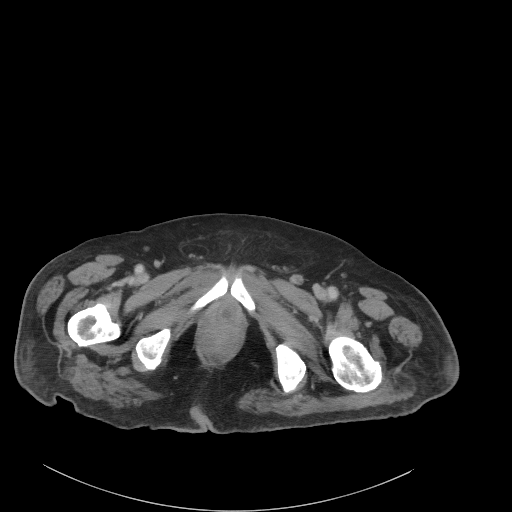
[im 5/93  bone]
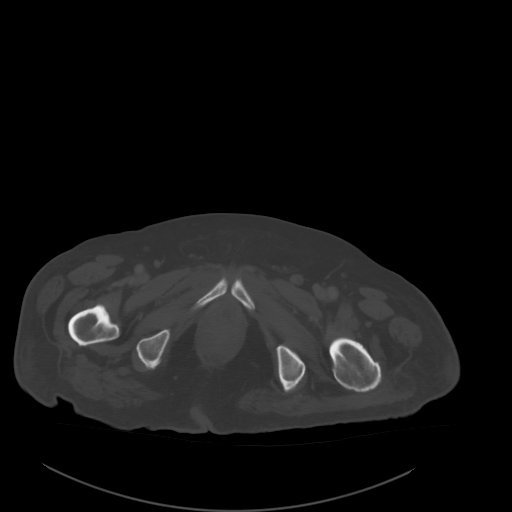
[im 13/93  soft-tissue]
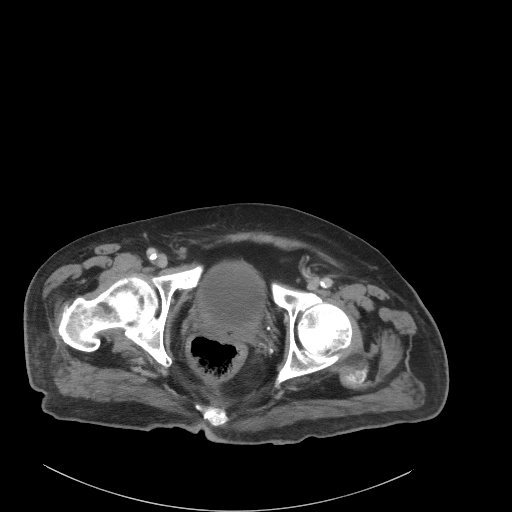
[im 21/93  soft-tissue]
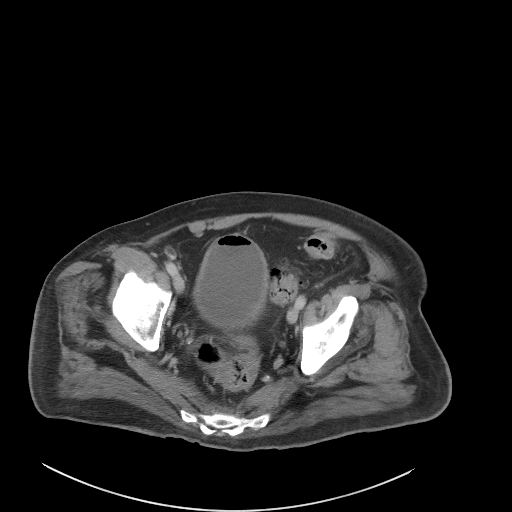
[im 26/93  soft-tissue]
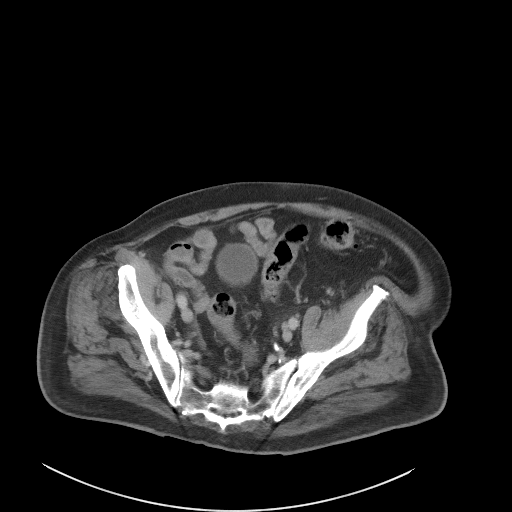
[im 34/93  soft-tissue]
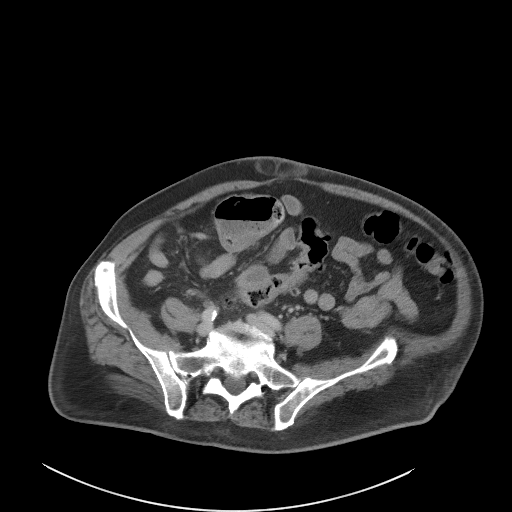
[im 38/93  soft-tissue]
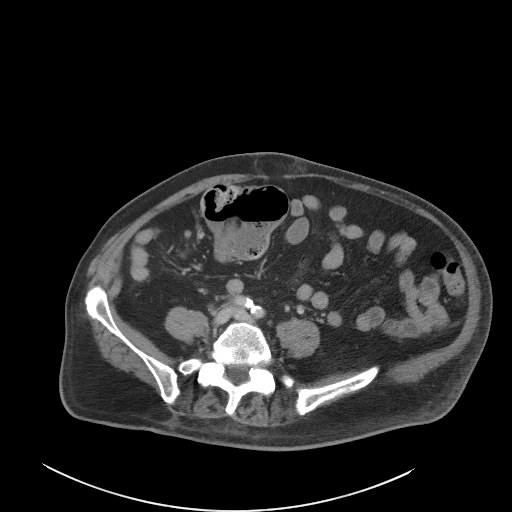
[im 47/93  soft-tissue]
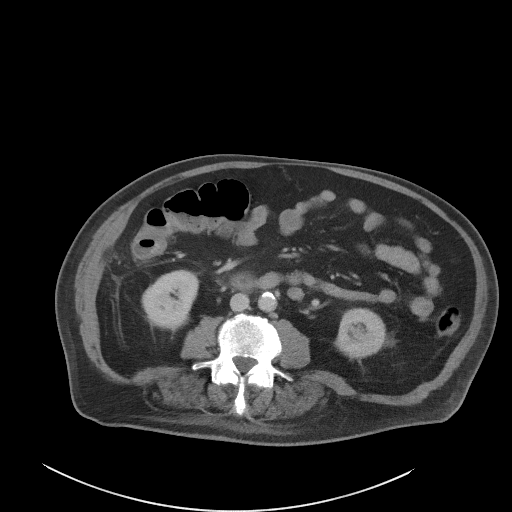
[im 55/93  soft-tissue]
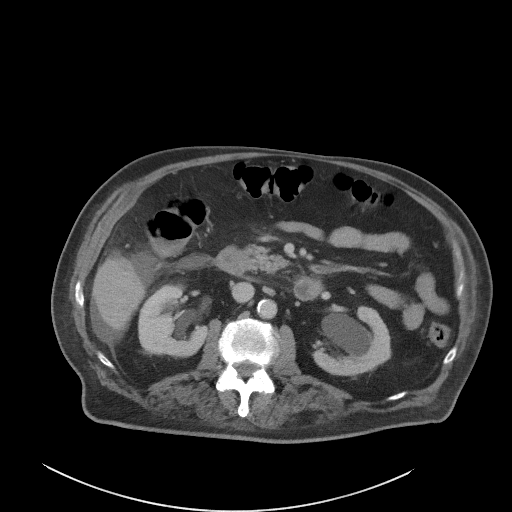
[im 59/93  soft-tissue]
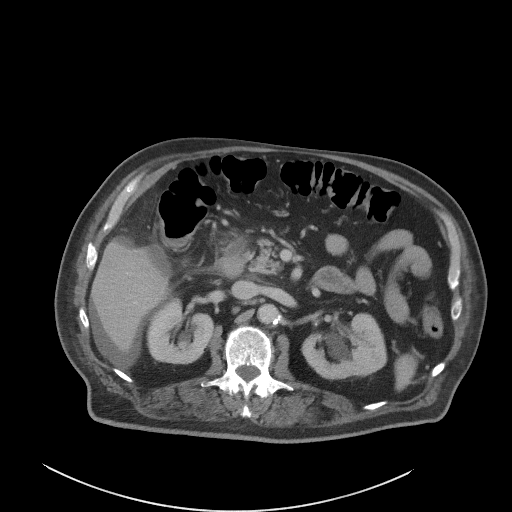
[im 59/93  bone]
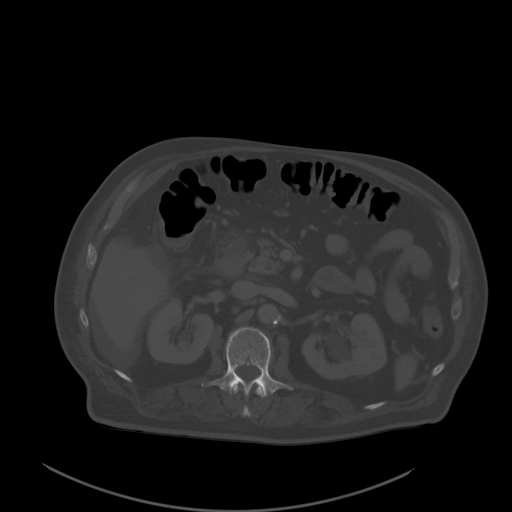
[im 67/93  soft-tissue]
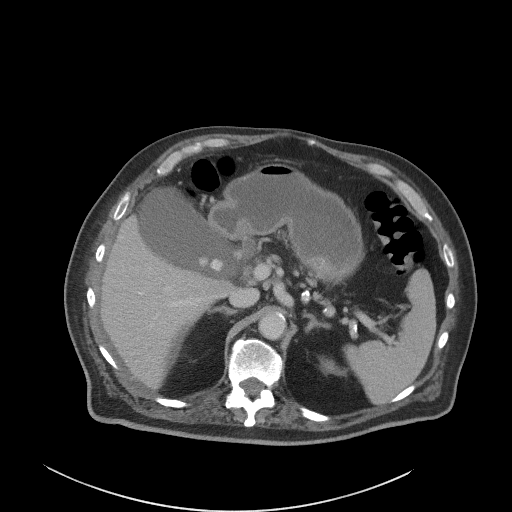
[im 72/93  soft-tissue]
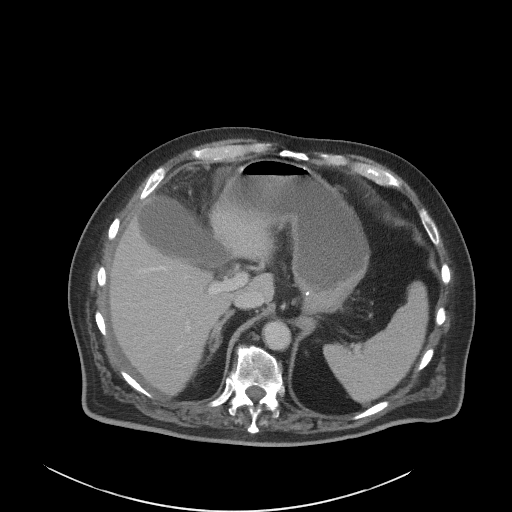
[im 80/93  soft-tissue]
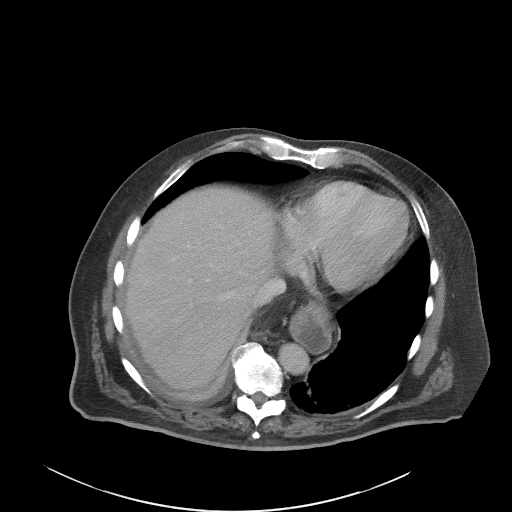
[im 88/93  soft-tissue]
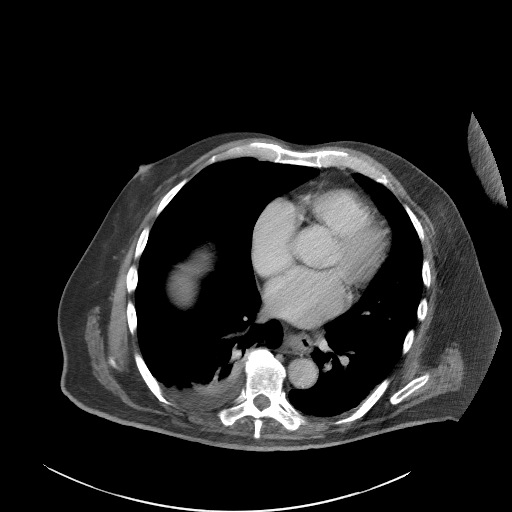

[Series 6: coronal st · coronal · 0.80mm/px · 3 of 89 slices shown]
[im 30/89  soft-tissue]
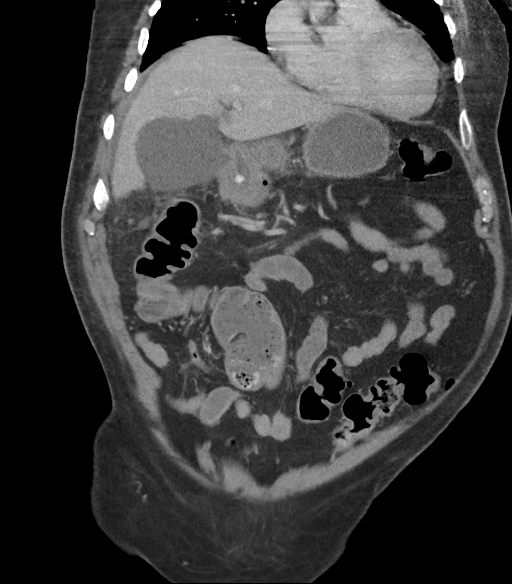
[im 40/89  soft-tissue]
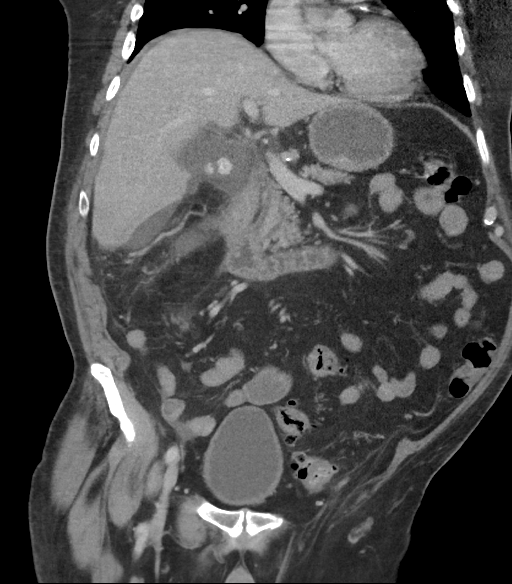
[im 49/89  soft-tissue]
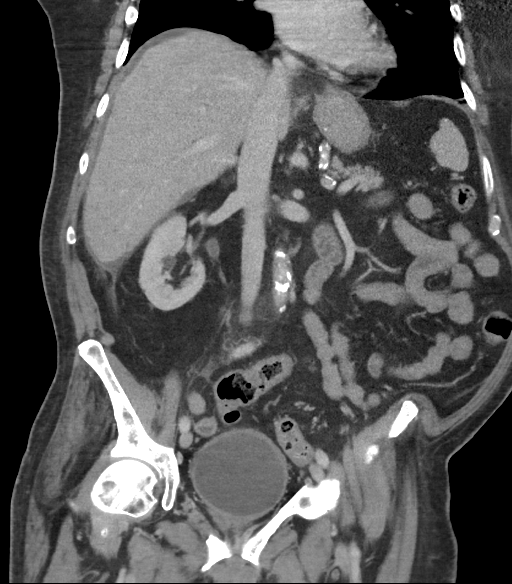

[16 of 46 positions shown; findings below may reference images not displayed]

FINDINGS: Lower chest: Small right-sided pleural effusion. Partial atelectasis
in the right lower lobe. The heart size is within normal limits.
Small hiatal hernia.

Hepatobiliary: Distended gallbladder with multiple calcified stones.
Mild soft tissue stranding in the right upper quadrant adjacent to
the gallbladder. Perihepatic fluid. Probable cyst at the porta
hepatis. No definite biliary enlargement.

Pancreas: Unremarkable. No pancreatic ductal dilatation or
surrounding inflammatory changes.

Spleen: Normal in size without focal abnormality.

Adrenals/Urinary Tract: Adrenal glands are normal. Parapelvic cysts.
No hydronephrosis. Air within the bladder. Possible small gas
containing diverticulum off the anterior wall of the bladder.

Stomach/Bowel: Stomach is nonenlarged. No dilated small bowel. No
colon wall thickening. Negative appendix. Sigmoid colon diverticula.

Vascular/Lymphatic: Moderate aortic atherosclerosis. No aneurysm. No
significantly enlarged lymph nodes.

Reproductive: Enlarged prostate. Fluid-filled defect within the
prostate which may be postsurgical.

Other: No free air. Trace free fluid in the pelvis. Small fat
containing supraumbilical ventral hernia.

Musculoskeletal: Degenerative changes. No acute or suspicious
abnormality.
IMPRESSION: 1. Dilated gallbladder with multiple calcified stones and suspected
pericholecystic fluid and mild inflammatory changes raising concern
for an acute cholecystitis. Suggest correlation with ultrasound.
There is perihepatic fluid, which may be secondary to inflammatory
process from the gallbladder.
2. Small right pleural effusion and partial atelectasis at the right
base.
3. Bilateral parapelvic renal cysts
4. Small fat containing supraumbilical ventral hernia

## 2019-07-12 IMAGING — US US ABDOMEN LIMITED
1 series · 13 of 25 positions shown · non-contrast
Comparison: CT abdomen and pelvis 12/25/2018

CLINICAL DATA: Right upper quadrant pain.  Abnormal LFTs.

EXAM:
ULTRASOUND ABDOMEN LIMITED RIGHT UPPER QUADRANT

[Series 1: us abdomen limited · 13 of 49 slices shown]
[im 1/49]
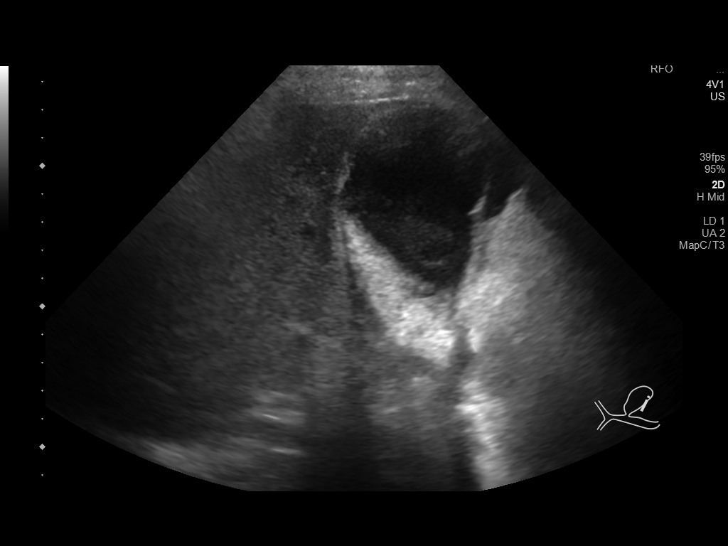
[im 5/49]
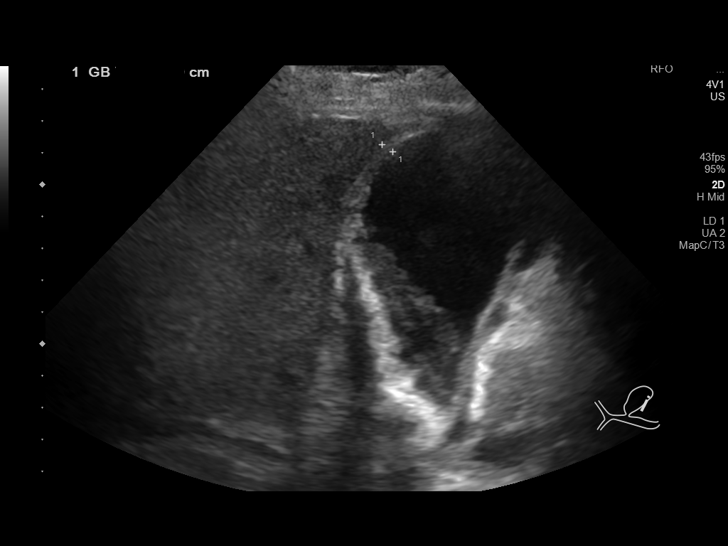
[im 9/49]
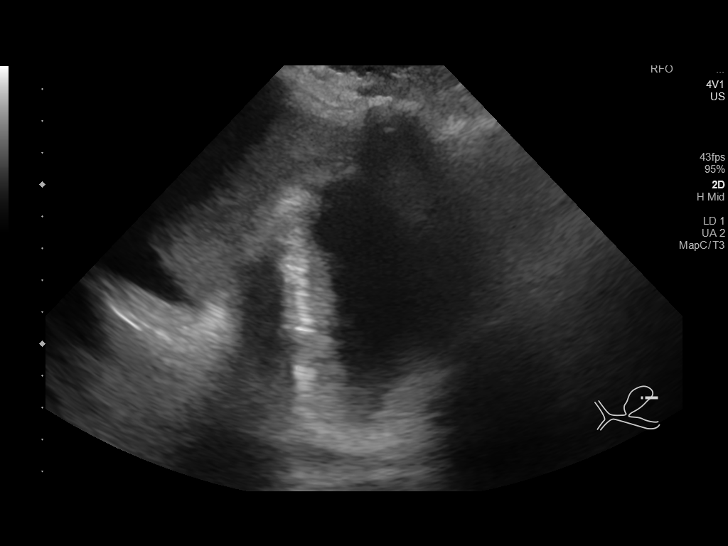
[im 13/49]
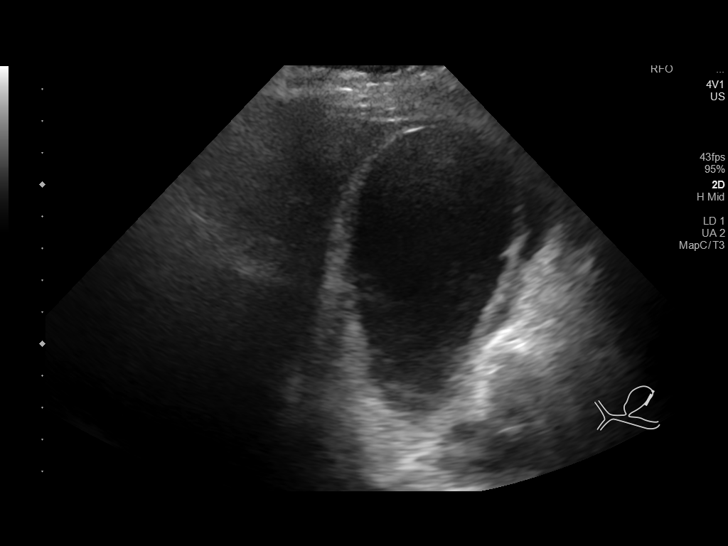
[im 17/49]
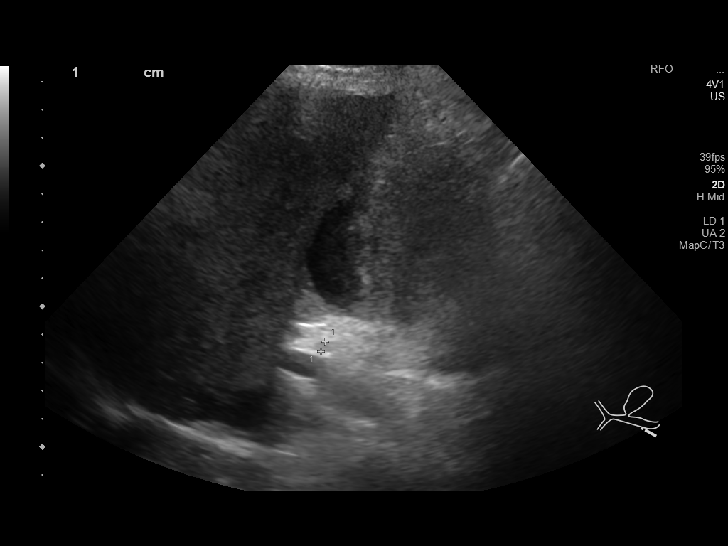
[im 21/49]
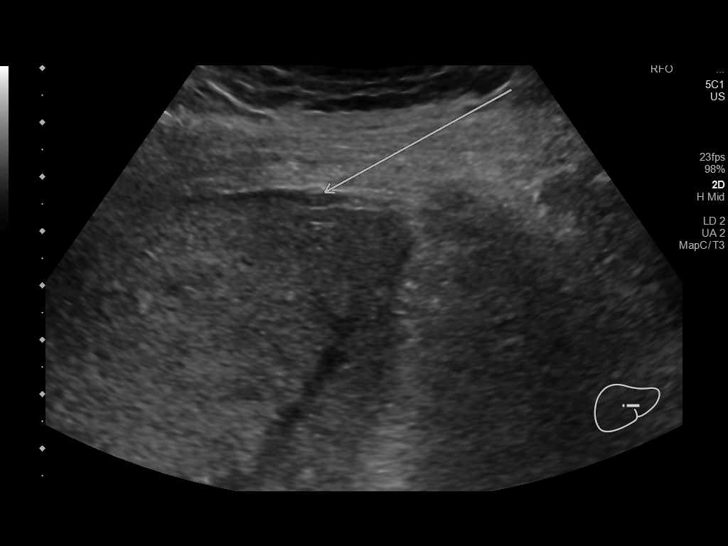
[im 25/49]
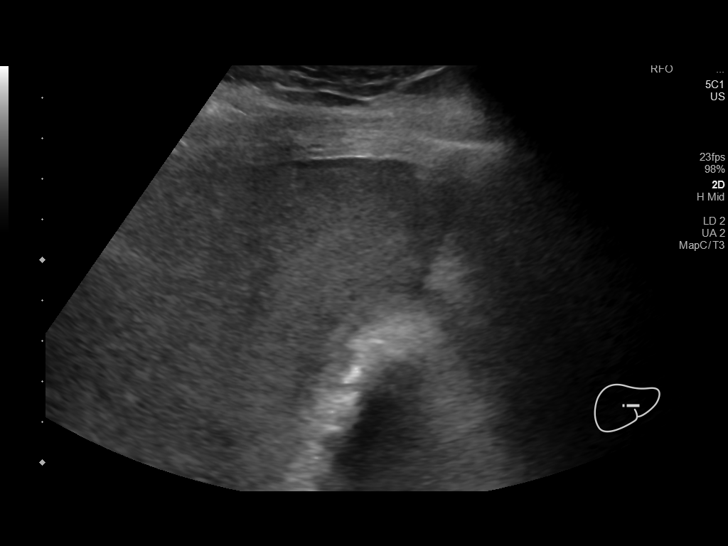
[im 29/49]
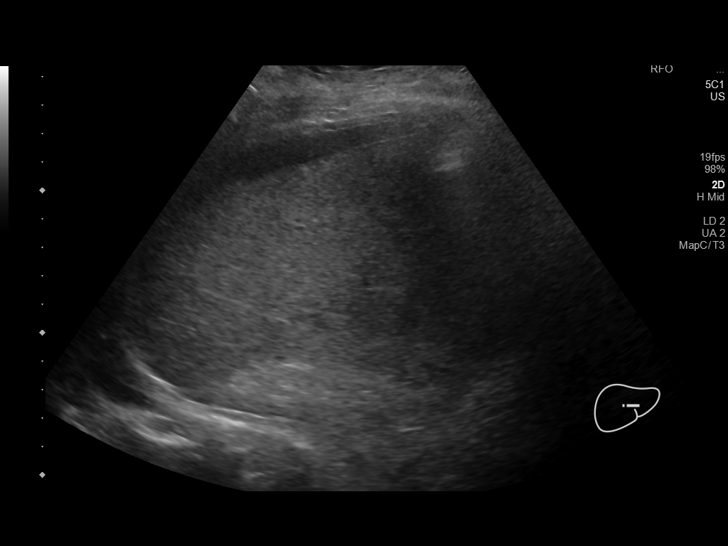
[im 33/49]
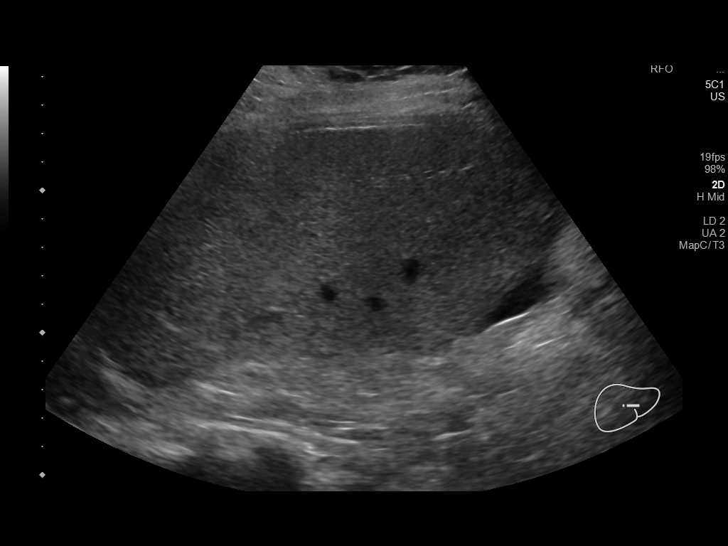
[im 37/49]
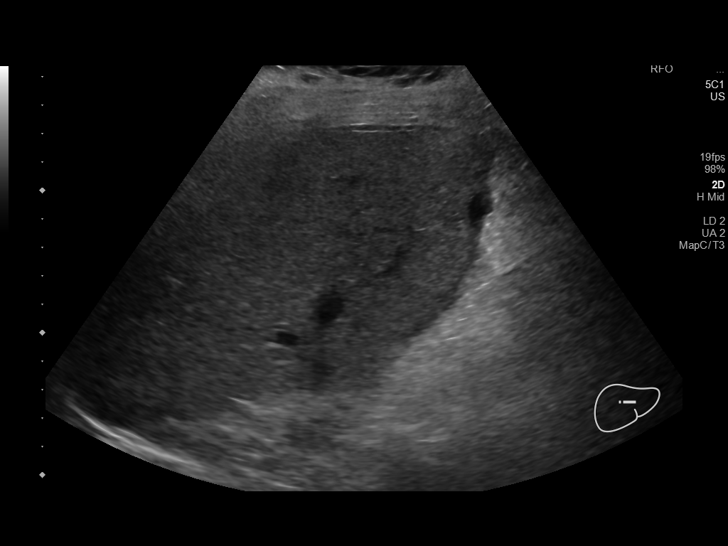
[im 41/49]
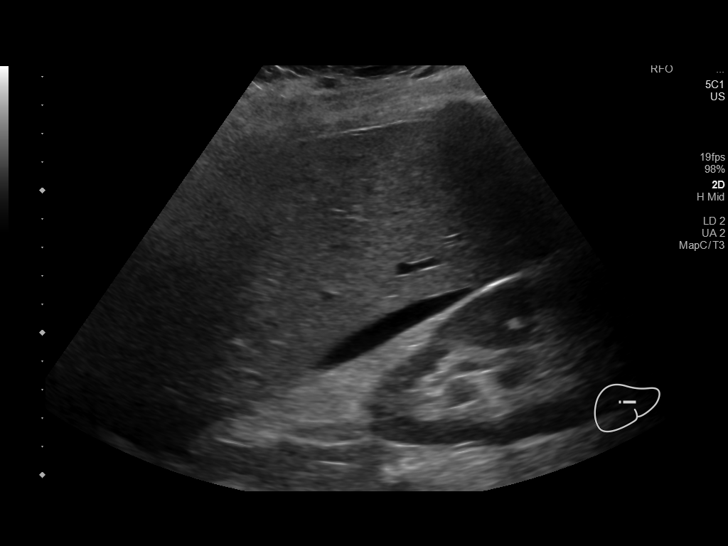
[im 45/49]
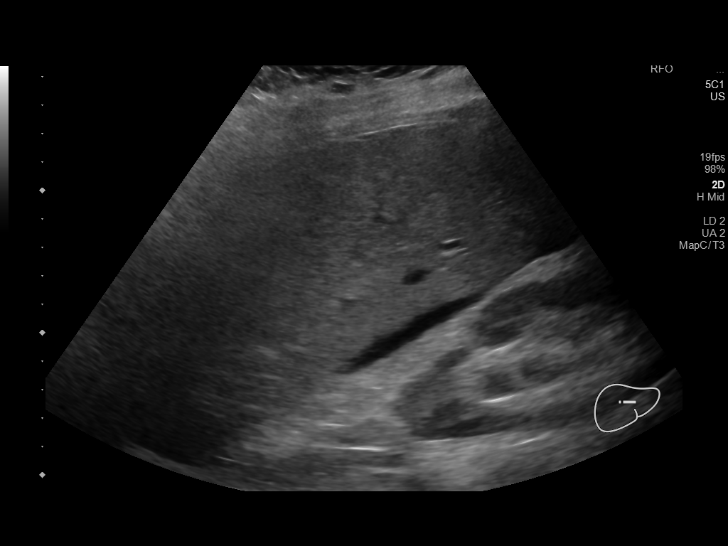
[im 49/49]
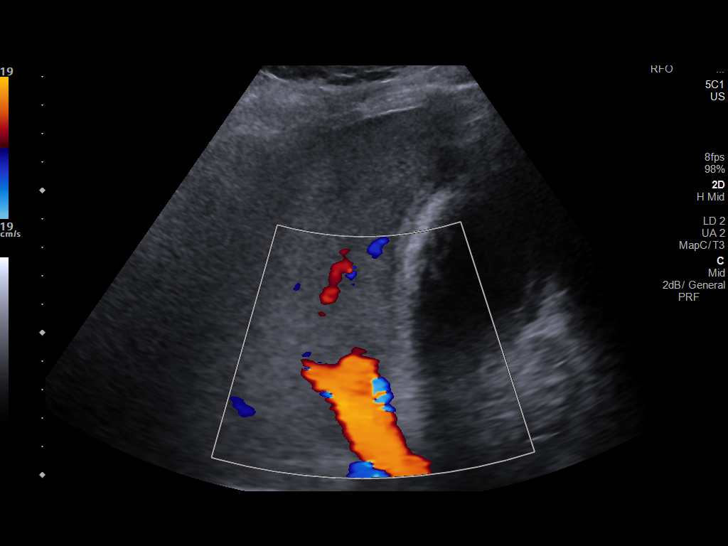

[13 of 25 positions shown; findings below may reference images not displayed]

FINDINGS: Gallbladder:

Numerous small stones in the gallbladder without a dominant,
discretely measurable stone. Mild gallbladder wall thickening
measuring 4 mm. Pericholecystic fluid. No sonographic Murphy sign
noted by sonographer.

Common bile duct:

Diameter: 4 mm

Liver:

Mildly increased parenchymal echogenicity diffusely with mildly
nodular liver contour. No focal lesion identified. Small volume
perihepatic free fluid. Portal vein is patent on color Doppler
imaging with normal direction of blood flow towards the liver.
IMPRESSION: 1. Numerous small gallstones with mild gallbladder wall thickening.
This may reflect acute cholecystitis in the appropriate clinical
setting (particularly given the gallbladder distension and regional
inflammatory changes on CT) although there was no sonographic Murphy
sign. Nuclear medicine HIDA scan could be considered to evaluate
cystic duct patency of clinically indicated.
2. Mildly echogenic liver, nonspecific though may reflect steatosis
or chronic hepatitis with early cirrhosis possible given mildly
nodular liver contour. Chronic liver disease could also contribute
to gallbladder wall thickening.
3. No biliary dilatation.

## 2019-07-15 IMAGING — CR DG CHEST 1V PORT
1 series · 1 of 1 positions shown · non-contrast
Comparison: 05/06/2018.

CLINICAL DATA: Leukocytosis.

EXAM:
PORTABLE CHEST 1 VIEW

[portable]
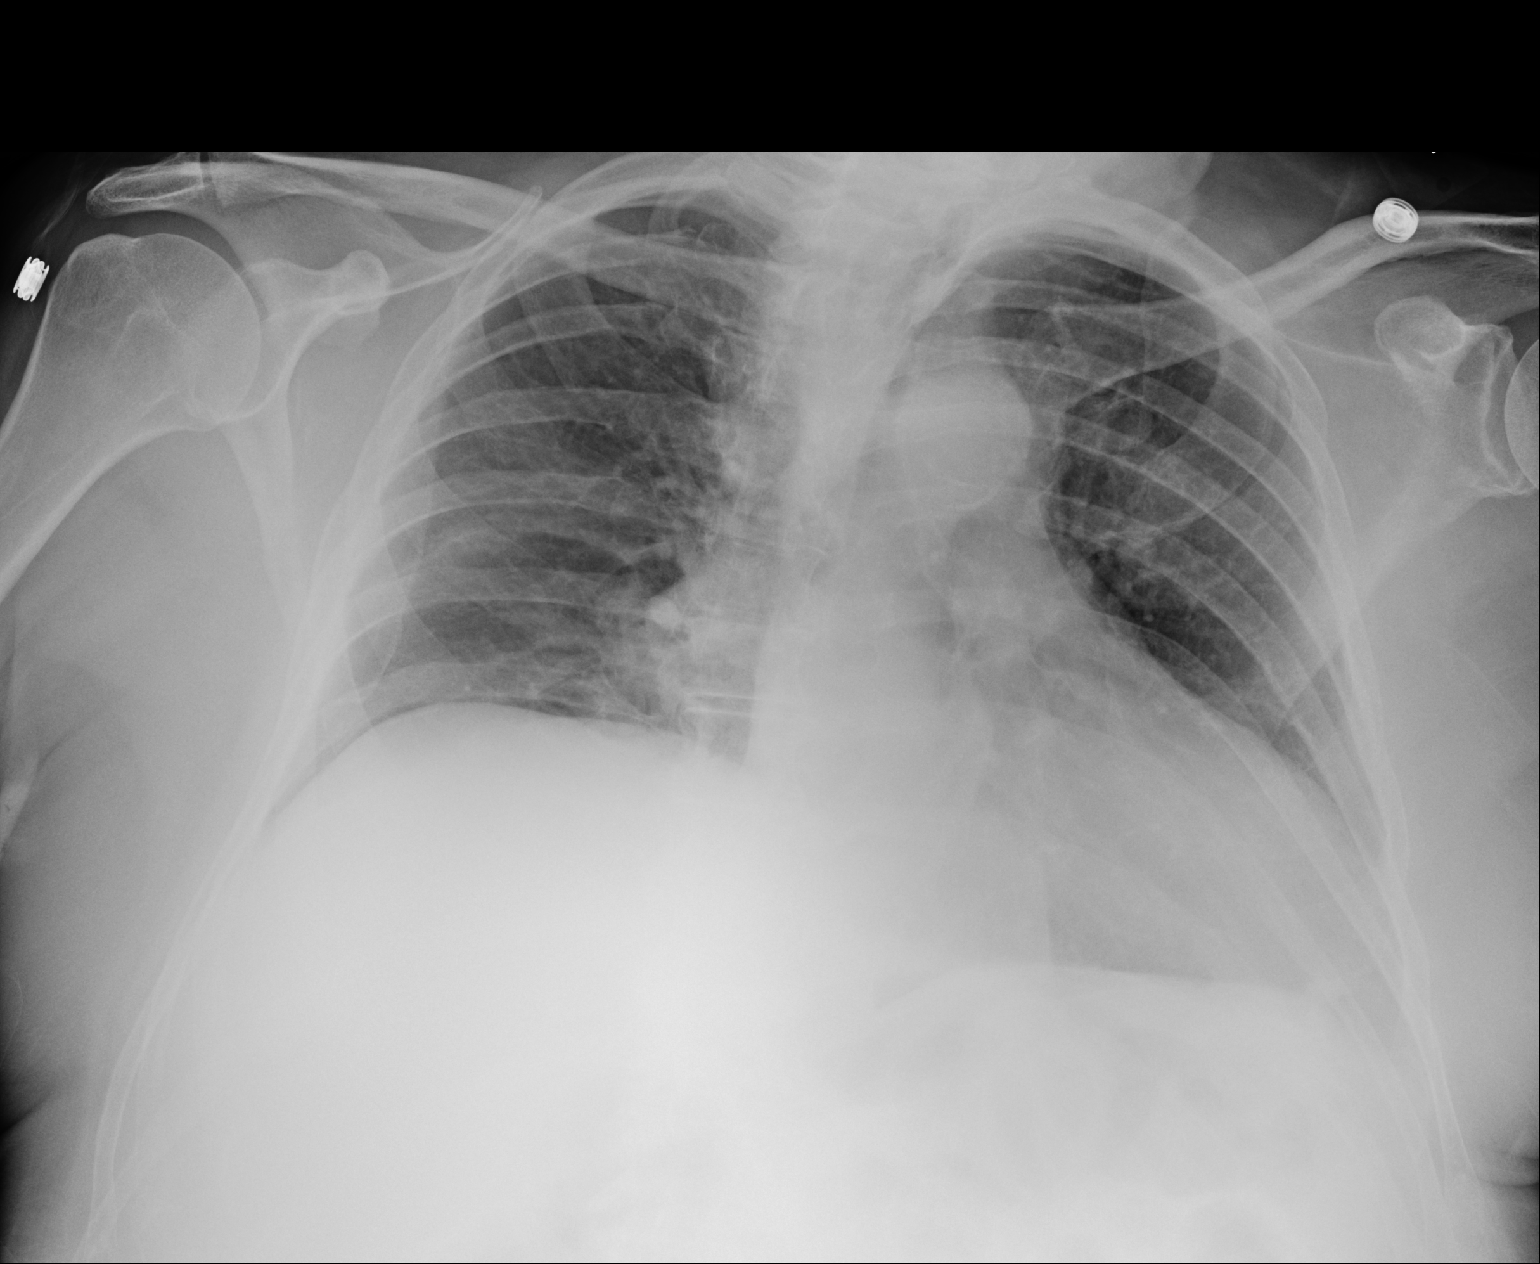

[1 of 1 positions shown; findings below may reference images not displayed]

FINDINGS: Patient is rotated to the left. Mediastinum and hilar structures
normal. Cardiomegaly. Mild right base atelectasis/infiltrate. Tiny
left pleural effusion versus pleural scarring. No pneumothorax.
IMPRESSION: 1.  Mild right base atelectasis/infiltrate.

2. Cardiomegaly. Tiny left pleural effusion versus pleural scarring.
